# Patient Record
Sex: Male | Born: 1950 | Race: White | Hispanic: No | Marital: Married | Smoking: Never smoker
Health system: Southern US, Community
[De-identification: ages and names within clinical notes are randomized; demographics above are authoritative.]

## PROBLEM LIST (undated history)

## (undated) DIAGNOSIS — K219 Gastro-esophageal reflux disease without esophagitis: Secondary | ICD-10-CM

## (undated) DIAGNOSIS — I2510 Atherosclerotic heart disease of native coronary artery without angina pectoris: Secondary | ICD-10-CM

## (undated) DIAGNOSIS — K635 Polyp of colon: Secondary | ICD-10-CM

## (undated) DIAGNOSIS — K5792 Diverticulitis of intestine, part unspecified, without perforation or abscess without bleeding: Secondary | ICD-10-CM

## (undated) DIAGNOSIS — E7800 Pure hypercholesterolemia, unspecified: Secondary | ICD-10-CM

## (undated) DIAGNOSIS — I10 Essential (primary) hypertension: Secondary | ICD-10-CM

## (undated) HISTORY — PX: TONSILLECTOMY: SUR1361

## (undated) HISTORY — DX: Diverticulitis of intestine, part unspecified, without perforation or abscess without bleeding: K57.92

## (undated) HISTORY — PX: ABDOMINAL PERINEAL BOWEL RESECTION: SHX1111

## (undated) HISTORY — DX: Pure hypercholesterolemia, unspecified: E78.00

## (undated) HISTORY — DX: Atherosclerotic heart disease of native coronary artery without angina pectoris: I25.10

## (undated) HISTORY — PX: KNEE ARTHROSCOPY: SUR90

## (undated) HISTORY — DX: Polyp of colon: K63.5

## (undated) HISTORY — DX: Gastro-esophageal reflux disease without esophagitis: K21.9

## (undated) HISTORY — DX: Essential (primary) hypertension: I10

## (undated) HISTORY — PX: HERNIA REPAIR: SHX51

---

## 2015-12-25 DIAGNOSIS — C44519 Basal cell carcinoma of skin of other part of trunk: Secondary | ICD-10-CM | POA: Insufficient documentation

## 2016-07-30 LAB — HM COLONOSCOPY

## 2019-01-11 DIAGNOSIS — K432 Incisional hernia without obstruction or gangrene: Secondary | ICD-10-CM | POA: Insufficient documentation

## 2020-05-08 DIAGNOSIS — M793 Panniculitis, unspecified: Secondary | ICD-10-CM | POA: Insufficient documentation

## 2020-05-08 DIAGNOSIS — E65 Localized adiposity: Secondary | ICD-10-CM | POA: Insufficient documentation

## 2020-05-10 DIAGNOSIS — I959 Hypotension, unspecified: Secondary | ICD-10-CM | POA: Insufficient documentation

## 2020-07-05 ENCOUNTER — Ambulatory Visit (INDEPENDENT_AMBULATORY_CARE_PROVIDER_SITE_OTHER): Payer: Medicare Other | Admitting: Family Medicine

## 2020-07-05 ENCOUNTER — Encounter: Payer: Self-pay | Admitting: Family Medicine

## 2020-07-05 ENCOUNTER — Other Ambulatory Visit: Payer: Self-pay

## 2020-07-05 VITALS — BP 129/74 | HR 67 | Temp 97.9°F | Ht 74.0 in | Wt 261.4 lb

## 2020-07-05 DIAGNOSIS — C44519 Basal cell carcinoma of skin of other part of trunk: Secondary | ICD-10-CM

## 2020-07-05 DIAGNOSIS — I2510 Atherosclerotic heart disease of native coronary artery without angina pectoris: Secondary | ICD-10-CM

## 2020-07-05 DIAGNOSIS — I10 Essential (primary) hypertension: Secondary | ICD-10-CM

## 2020-07-05 DIAGNOSIS — E785 Hyperlipidemia, unspecified: Secondary | ICD-10-CM

## 2020-07-05 DIAGNOSIS — D485 Neoplasm of uncertain behavior of skin: Secondary | ICD-10-CM | POA: Diagnosis not present

## 2020-07-05 DIAGNOSIS — D500 Iron deficiency anemia secondary to blood loss (chronic): Secondary | ICD-10-CM

## 2020-07-05 DIAGNOSIS — N400 Enlarged prostate without lower urinary tract symptoms: Secondary | ICD-10-CM

## 2020-07-05 NOTE — Assessment & Plan Note (Signed)
Blood loss anemia related to hematoma after recent hernia repair.  Check CBC and ferritin.

## 2020-07-05 NOTE — Assessment & Plan Note (Signed)
Followed by cardiology in [place] for non-obstructive CAD and HTN.  Requests referral to cardiology here.  Referral entered.

## 2020-07-05 NOTE — Assessment & Plan Note (Signed)
Update PSA 

## 2020-07-05 NOTE — Assessment & Plan Note (Signed)
Blood pressure is at goal at for age and co-morbidities.  I recommend he continue current medications.  In addition they were instructed to follow a low sodium diet with regular exercise to help to maintain adequate control of blood pressure.   

## 2020-07-05 NOTE — Patient Instructions (Addendum)
Great to meet you today! Have labs completed.  We'll be in touch with results.  You'll be contacted from specialists office.  See me again in 6 months or sooner if needed

## 2020-07-05 NOTE — Assessment & Plan Note (Signed)
Check direct LDL today.

## 2020-07-05 NOTE — Assessment & Plan Note (Signed)
History of neoplasm of the skin.  Referral entered to dermatology for continued monitoring.

## 2020-07-05 NOTE — Progress Notes (Signed)
Terry Acosta - 69 y.o. male MRN [id number]  Date of birth: [birth year]  Subjective Chief Complaint  Patient presents with  . Establish Care    HPI Terry Acosta is a 69 y.o. male here today for initial visit.  He recently moved to the area from [place].  He has a history of HTN, CAD, HLD, and OSA.  He has had multiple abdominal surgeries including bowel resections for diverticulitis and subsequent hernia repairs.  He had hernia repair just before moving to the area and had post operative hematoma and anemia.  He needs updated CBC.     He was followed by cardiology in [place].  History of non-obstructive CAD.  He does also have HTN managed with lisinopril and atenolol.  Denies symptoms related to HTN including chest pain, shortness of breath, palpitations, headache or vision changes.    HLD managed with crestor, tolerating well.  No myalgias.  ROS:  A comprehensive ROS was completed and negative except as noted per HPI  Allergies  Allergen Reactions  . Penicillins Other (See Comments)    Syncope  . Shellfish Allergy     Past Medical History:  Diagnosis Date  . Colon polyps   . High cholesterol     Past Surgical History:  Procedure Laterality Date  . ABDOMINAL PERINEAL BOWEL RESECTION     x 2  . HERNIA REPAIR     x 6  . KNEE ARTHROSCOPY Bilateral     Social History   Socioeconomic History  . Marital status: Married    Spouse name: Not on file  . Number of children: Not on file  . Years of education: Not on file  . Highest education level: Not on file  Occupational History  . Occupation: Self Employed  Tobacco Use  . Smoking status: Never Smoker  . Smokeless tobacco: Never Used  Vaping Use  . Vaping Use: Never used  Substance and Sexual Activity  . Alcohol use: Yes    Alcohol/week: 5.0 standard drinks    Types: 5 Standard drinks or equivalent per week  . Drug use: Never  . Sexual activity: Yes    Partners: Female  Other Topics Concern  . Not on file   Social History Narrative  . Not on file   Social Determinants of Health   Financial Resource Strain:   . Difficulty of Paying Living Expenses:   Food Insecurity:   . Worried About Charity fundraiser in the Last Year:   . Arboriculturist in the Last Year:   Transportation Needs:   . Film/video editor (Medical):   Marland Kitchen Lack of Transportation (Non-Medical):   Physical Activity:   . Days of Exercise per Week:   . Minutes of Exercise per Session:   Stress:   . Feeling of Stress :   Social Connections:   . Frequency of Communication with Friends and Family:   . Frequency of Social Gatherings with Friends and Family:   . Attends Religious Services:   . Active Member of Clubs or Organizations:   . Attends Archivist Meetings:   Marland Kitchen Marital Status:     Family History  Problem Relation Age of Onset  . Hypertension Father   . Prostate cancer Father     Health Maintenance  Topic Date Due  . Hepatitis C Screening  Never done  . COLONOSCOPY  Never done  . PNA vac Low Risk Adult (2 of 2 - PPSV23) 12/15/2015  . INFLUENZA VACCINE  07/01/2020  . TETANUS/TDAP  12/16/2023  . COVID-19 Vaccine  Completed     ----------------------------------------------------------------------------------------------------------------------------------------------------------------------------------------------------------------- Physical Exam BP 129/74 (BP Location: Left Arm, Patient Position: Sitting, Cuff Size: Large)   Pulse 67   Temp 97.9 F (36.6 C) (Temporal)   Ht 6\' 2"  (1.88 m)   Wt 261 lb 6.4 oz (118.6 kg)   SpO2 99%   BMI 33.56 kg/m   Physical Exam Constitutional:      Appearance: Normal appearance.  HENT:     Head: Normocephalic and atraumatic.     Mouth/Throat:     Mouth: Mucous membranes are moist.  Eyes:     General: No scleral icterus. Cardiovascular:     Rate and Rhythm: Normal rate and regular rhythm.  Pulmonary:     Effort: Pulmonary effort is normal.      Breath sounds: Normal breath sounds.  Musculoskeletal:     Cervical back: Neck supple.  Neurological:     General: No focal deficit present.     Mental Status: He is alert.  Psychiatric:        Mood and Affect: Mood normal.        Behavior: Behavior normal.     ------------------------------------------------------------------------------------------------------------------------------------------------------------------------------------------------------------------- Assessment and Plan  Essential hypertension Blood pressure is at goal at for age and co-morbidities.  I recommend he continue current medications.  In addition they were instructed to follow a low sodium diet with regular exercise to help to maintain adequate control of blood pressure.    Basal cell carcinoma of abdomen History of neoplasm of the skin.  Referral entered to dermatology for continued monitoring.    Benign prostatic hyperplasia without lower urinary tract symptoms Update PSA  Blood loss anemia Blood loss anemia related to hematoma after recent hernia repair.  Check CBC and ferritin.    Hyperlipidemia Check direct LDL today.   Coronary artery disease involving native coronary artery of native heart without angina pectoris Followed by cardiology in [place] for non-obstructive CAD and HTN.  Requests referral to cardiology here.  Referral entered.     No orders of the defined types were placed in this encounter.   Return in about 6 months (around 01/05/2021) for HTN/HLD/Insomnia.    This visit occurred during the SARS-CoV-2 public health emergency.  Safety protocols were in place, including screening questions prior to the visit, additional usage of staff PPE, and extensive cleaning of exam room while observing appropriate contact time as indicated for disinfecting solutions.

## 2020-07-07 LAB — COMPLETE METABOLIC PANEL WITH GFR
AG Ratio: 1.4 (calc) (ref 1.0–2.5)
ALT: 14 U/L (ref 9–46)
AST: 17 U/L (ref 10–35)
Albumin: 3.7 g/dL (ref 3.6–5.1)
Alkaline phosphatase (APISO): 63 U/L (ref 35–144)
BUN/Creatinine Ratio: 13 (calc) (ref 6–22)
BUN: 9 mg/dL (ref 7–25)
CO2: 27 mmol/L (ref 20–32)
Calcium: 8.9 mg/dL (ref 8.6–10.3)
Chloride: 108 mmol/L (ref 98–110)
Creat: 0.68 mg/dL — ABNORMAL LOW (ref 0.70–1.18)
GFR, Est African American: 111 mL/min/{1.73_m2} (ref >=60)
GFR, Est Non African American: 96 mL/min/{1.73_m2} (ref >=60)
Globulin: 2.6 g/dL (calc) (ref 1.9–3.7)
Glucose, Bld: 110 mg/dL — ABNORMAL HIGH (ref 65–99)
Potassium: 5 mmol/L (ref 3.5–5.3)
Sodium: 144 mmol/L (ref 135–146)
Total Bilirubin: 0.4 mg/dL (ref 0.2–1.2)
Total Protein: 6.3 g/dL (ref 6.1–8.1)

## 2020-07-07 LAB — CBC
HCT: 33.8 % — ABNORMAL LOW (ref 38.5–50.0)
Hemoglobin: 10.4 g/dL — ABNORMAL LOW (ref 13.2–17.1)
MCH: 25.3 pg — ABNORMAL LOW (ref 27.0–33.0)
MCHC: 30.8 g/dL — ABNORMAL LOW (ref 32.0–36.0)
MCV: 82.2 fL (ref 80.0–100.0)
MPV: 11.3 fL (ref 7.5–12.5)
Platelets: 295 10*3/uL (ref 140–400)
RBC: 4.11 10*6/uL — ABNORMAL LOW (ref 4.20–5.80)
RDW: 15.7 % — ABNORMAL HIGH (ref 11.0–15.0)
WBC: 5.4 10*3/uL (ref 3.8–10.8)

## 2020-07-07 LAB — PSA: PSA: 2.6 ng/mL (ref <=4.0)

## 2020-07-07 LAB — LDL CHOLESTEROL, DIRECT: Direct LDL: 71 mg/dL (ref <100)

## 2020-07-07 LAB — FERRITIN: Ferritin: 14 ng/mL — ABNORMAL LOW (ref 24–380)

## 2020-08-29 NOTE — Progress Notes (Signed)
Referring-Cody Zigmund Daniel, DO Reason for referral-coronary artery disease  HPI: 69 year old male for evaluation of coronary artery disease at request of Luetta Nutting, DO.  Holter monitor 2017 showed sinus rhythm with PACs and what is described as 4-6 beats of paroxysms of atrial fibrillation.  Echocardiogram 2017 showed normal LV function and mild left ventricular hypertrophy.  Abdominal ultrasound February 2019 showed no aneurysm.  Cardiac catheterization March 2019 showed less than 50% involving the mid right coronary artery and proximal LAD.  Patient recently moved to this area from [place] and presents to establish.  He denies dyspnea, chest pain, palpitations or syncope.  He has minimal pedal edema.  He does have some dizziness with standing at times.  He states he underwent abdominal surgery in February complicated by blood loss.  His symptoms have been present since then but are slowly improving.  Current Outpatient Medications  Medication Sig Dispense Refill   atenolol (TENORMIN) 50 MG tablet Take 50 mg by mouth daily.     esomeprazole (NEXIUM) 40 MG capsule Take 40 mg by mouth daily at 12 noon.     Ferrous Sulfate Dried (HIGH POTENCY IRON) 65 MG TABS Take 1 tablet by mouth as directed.     hydrOXYzine (ATARAX/VISTARIL) 25 MG tablet Take 25 mg by mouth in the morning.     lisinopril (ZESTRIL) 5 MG tablet Take 5 mg by mouth daily.     magnesium gluconate (MAGONATE) 500 MG tablet Take 500 mg by mouth daily.     Multiple Vitamin (MULTIVITAMIN) capsule Take 1 capsule by mouth daily.     Respiratory Therapy Supplies DEVI by Does not apply route.     rosuvastatin (CRESTOR) 40 MG tablet Take 40 mg by mouth daily.     Turmeric (QC TUMERIC COMPLEX PO) Take 1 tablet by mouth daily.     zolpidem (AMBIEN) 5 MG tablet      No current facility-administered medications for this visit.    Allergies  Allergen Reactions   Penicillins Other (See Comments)    Syncope   Shellfish  Allergy      Past Medical History:  Diagnosis Date   CAD (coronary artery disease)    Colon polyps    Diverticulitis    GERD (gastroesophageal reflux disease)    High cholesterol    Hypertension     Past Surgical History:  Procedure Laterality Date   ABDOMINAL PERINEAL BOWEL RESECTION     x 2   HERNIA REPAIR     x 6   KNEE ARTHROSCOPY Bilateral    TONSILLECTOMY      Social History   Socioeconomic History   Marital status: Married    Spouse name: Not on file   Number of children: 2   Years of education: Not on file   Highest education level: Not on file  Occupational History   Occupation: Self Employed  Tobacco Use   Smoking status: Never Smoker   Smokeless tobacco: Never Used  Scientific laboratory technician Use: Never used  Substance and Sexual Activity   Alcohol use: Yes    Alcohol/week: 5.0 standard drinks    Types: 5 Standard drinks or equivalent per week   Drug use: Never   Sexual activity: Yes    Partners: Female  Other Topics Concern   Not on file  Social History Narrative   Not on file   Social Determinants of Health   Financial Resource Strain:    Difficulty of Paying Living Expenses: Not on  file  Food Insecurity:    Worried About Charity fundraiser in the Last Year: Not on file   [employer] of Food in the Last Year: Not on file  Transportation Needs:    Lack of Transportation (Medical): Not on file   Lack of Transportation (Non-Medical): Not on file  Physical Activity:    Days of Exercise per Week: Not on file   Minutes of Exercise per Session: Not on file  Stress:    Feeling of Stress : Not on file  Social Connections:    Frequency of Communication with Friends and Family: Not on file   Frequency of Social Gatherings with Friends and Family: Not on file   Attends Religious Services: Not on file   Active Member of Clubs or Organizations: Not on file   Attends Archivist Meetings: Not on file   Marital  Status: Not on file  Intimate Partner Violence:    Fear of Current or Ex-Partner: Not on file   Emotionally Abused: Not on file   Physically Abused: Not on file   Sexually Abused: Not on file    Family History  Problem Relation Age of Onset   Hypertension Father    Prostate cancer Father     ROS: no fevers or chills, productive cough, hemoptysis, dysphasia, odynophagia, melena, hematochezia, dysuria, hematuria, rash, seizure activity, orthopnea, PND, pedal edema, claudication. Remaining systems are negative.  Physical Exam:   Blood pressure (!) 153/85, pulse 67, height 6\' 2"  (1.88 m), weight 266 lb 1.9 oz (120.7 kg), SpO2 96 %.  General:  Well developed/well nourished in NAD Skin warm/dry Patient not depressed No peripheral clubbing Back-normal HEENT-normal/normal eyelids Neck supple/normal carotid upstroke bilaterally; no bruits; no JVD; no thyromegaly chest - CTA/ normal expansion CV - RRR/normal S1 and S2; no murmurs, rubs or gallops;  PMI nondisplaced Abdomen -NT/ND, no HSM, no mass, + bowel sounds, no bruit 2+ femoral pulses, no bruits Ext-trace edema, no chords, 2+ DP Neuro-grossly nonfocal  ECG -sinus rhythm at a rate of 67, occasional PVC, no ST changes.  Personally reviewed  A/P  1 coronary artery disease-plan to continue statin.  Resume aspirin 81 mg daily as he has had no recurrent bleeding.  He is not having chest pain and we will plan medical therapy.  2 hyperlipidemia-continue statin.  3 hypertension-blood pressure is controlled.  Continue present medications and follow.  4 question atrial fibrillation-previous Holter report describes 4 and 6 beat runs of atrial fibrillation.  Strips are not available for review.  This would seem to be very short and I cannot be sure that this is true atrial fibrillation.  Kirk Ruths, MD

## 2020-09-05 ENCOUNTER — Other Ambulatory Visit: Payer: Self-pay

## 2020-09-05 ENCOUNTER — Ambulatory Visit (INDEPENDENT_AMBULATORY_CARE_PROVIDER_SITE_OTHER): Payer: Medicare Other | Admitting: Cardiology

## 2020-09-05 ENCOUNTER — Encounter: Payer: Self-pay | Admitting: Cardiology

## 2020-09-05 VITALS — BP 153/85 | HR 67 | Ht 74.0 in | Wt 266.1 lb

## 2020-09-05 DIAGNOSIS — E7800 Pure hypercholesterolemia, unspecified: Secondary | ICD-10-CM

## 2020-09-05 DIAGNOSIS — I10 Essential (primary) hypertension: Secondary | ICD-10-CM | POA: Diagnosis not present

## 2020-09-05 DIAGNOSIS — I2510 Atherosclerotic heart disease of native coronary artery without angina pectoris: Secondary | ICD-10-CM | POA: Diagnosis not present

## 2020-09-05 MED ORDER — ASPIRIN EC 81 MG PO TBEC: 81.0000 mg | DELAYED_RELEASE_TABLET | Freq: Every day | ORAL | 3 refills | Status: DC

## 2020-09-05 NOTE — Patient Instructions (Signed)
Medication Instructions:   START ASPIRIN 81 MG ONCE DAILY  *If you need a refill on your cardiac medications before your next appointment, please call your pharmacy*   Follow-Up: At [facility], you and your health needs are our priority.  As part of our continuing mission to provide you with exceptional heart care, we have created designated Provider Care Teams.  These Care Teams include your primary Cardiologist (physician) and Advanced Practice Providers (APPs -  Physician Assistants and Nurse Practitioners) who all work together to provide you with the care you need, when you need it.  We recommend signing up for the patient portal called "MyChart".  Sign up information is provided on this After Visit Summary.  MyChart is used to connect with patients for Virtual Visits (Telemedicine).  Patients are able to view lab/test results, encounter notes, upcoming appointments, etc.  Non-urgent messages can be sent to your provider as well.   To learn more about what you can do with MyChart, go to [email or web address].    Your next appointment:   6 month(s)  The format for your next appointment:   In Person  Provider:   Brian Crenshaw, MD    

## 2020-09-17 ENCOUNTER — Encounter: Payer: Self-pay | Admitting: Family Medicine

## 2020-09-18 ENCOUNTER — Other Ambulatory Visit: Payer: Self-pay

## 2020-09-18 MED ORDER — ZOLPIDEM TARTRATE 5 MG PO TABS
5.0000 mg | ORAL_TABLET | Freq: Every day | ORAL | 0 refills | Status: DC
Start: 2020-09-18 — End: 2020-09-19

## 2020-09-18 NOTE — Progress Notes (Unsigned)
Per patient request

## 2020-09-19 ENCOUNTER — Other Ambulatory Visit: Payer: Self-pay | Admitting: Family Medicine

## 2020-09-19 MED ORDER — ZOLPIDEM TARTRATE 5 MG PO TABS
5.0000 mg | ORAL_TABLET | Freq: Every day | ORAL | 1 refills | Status: DC
Start: 2020-09-19 — End: 2021-03-19

## 2020-09-21 ENCOUNTER — Other Ambulatory Visit: Payer: Self-pay | Admitting: Family Medicine

## 2020-09-21 DIAGNOSIS — G4733 Obstructive sleep apnea (adult) (pediatric): Secondary | ICD-10-CM

## 2020-09-21 MED ORDER — AMBULATORY NON FORMULARY MEDICATION: 0 refills | Status: AC

## 2020-09-25 ENCOUNTER — Telehealth: Payer: Self-pay

## 2020-09-25 NOTE — Telephone Encounter (Signed)
Received Sleep Study from Dr. Normajean Glasgow.   Faxed to [facility] with demographics ([phone]).

## 2020-11-13 ENCOUNTER — Encounter: Payer: Self-pay | Admitting: Family Medicine

## 2020-11-13 ENCOUNTER — Other Ambulatory Visit: Payer: Self-pay

## 2020-11-13 ENCOUNTER — Ambulatory Visit (INDEPENDENT_AMBULATORY_CARE_PROVIDER_SITE_OTHER): Payer: Medicare Other | Admitting: Family Medicine

## 2020-11-13 DIAGNOSIS — L739 Follicular disorder, unspecified: Secondary | ICD-10-CM | POA: Diagnosis not present

## 2020-11-13 MED ORDER — MUPIROCIN 2 % EX OINT: 1.0000 "application " | TOPICAL_OINTMENT | Freq: Two times a day (BID) | CUTANEOUS | 0 refills | Status: AC

## 2020-11-13 MED ORDER — DOXYCYCLINE HYCLATE 100 MG PO TABS: 100.0000 mg | ORAL_TABLET | Freq: Two times a day (BID) | ORAL | 0 refills | Status: DC

## 2020-11-13 NOTE — Progress Notes (Signed)
Terry Acosta - 69 y.o. male MRN [id number]  Date of birth: Sep 11, 1951  Subjective No chief complaint on file.   HPI Terry Acosta is a 69 y.o. male here today with complaint of rash in groin area.  Reports some pain redness and swelling along upper pubic area.  This started a couple of days ago.  Some pustules that he has popped.  He has had this before and treated with antibiotic.  Told he had MRSA in the past.  He denies swelling, fever, chills.    ROS:  A comprehensive ROS was completed and negative except as noted per HPI  Allergies  Allergen Reactions  . Penicillins Other (See Comments)    Syncope  . Shellfish Allergy     Past Medical History:  Diagnosis Date  . CAD (coronary artery disease)   . Colon polyps   . Diverticulitis   . GERD (gastroesophageal reflux disease)   . High cholesterol   . Hypertension     Past Surgical History:  Procedure Laterality Date  . ABDOMINAL PERINEAL BOWEL RESECTION     x 2  . HERNIA REPAIR     x 6  . KNEE ARTHROSCOPY Bilateral   . TONSILLECTOMY      Social History   Socioeconomic History  . Marital status: Married    Spouse name: Not on file  . Number of children: 2  . Years of education: Not on file  . Highest education level: Not on file  Occupational History  . Occupation: Self Employed  Tobacco Use  . Smoking status: Never Smoker  . Smokeless tobacco: Never Used  Vaping Use  . Vaping Use: Never used  Substance and Sexual Activity  . Alcohol use: Yes    Alcohol/week: 5.0 standard drinks    Types: 5 Standard drinks or equivalent per week  . Drug use: Never  . Sexual activity: Yes    Partners: Female  Other Topics Concern  . Not on file  Social History Narrative  . Not on file   Social Determinants of Health   Financial Resource Strain: Not on file  Food Insecurity: Not on file  Transportation Needs: Not on file  Physical Activity: Not on file  Stress: Not on file  Social Connections: Not on file     Family History  Problem Relation Age of Onset  . Hypertension Father   . Prostate cancer Father     Health Maintenance  Topic Date Due  . Hepatitis C Screening  Never done  . COLONOSCOPY  Never done  . COVID-19 Vaccine (3 - Moderna risk 4-dose series) 04/04/2020  . INFLUENZA VACCINE  07/01/2020  . TETANUS/TDAP  12/16/2023  . PNA vac Low Risk Adult  Completed     ----------------------------------------------------------------------------------------------------------------------------------------------------------------------------------------------------------------- Physical Exam BP (!) 155/80 (BP Location: Left Arm, Patient Position: Sitting)   Pulse 67   Temp 97.8 F (36.6 C)   Wt 277 lb 12.8 oz (126 kg)   SpO2 100%   BMI 35.67 kg/m   Physical Exam Constitutional:      Appearance: Normal appearance.  Eyes:     General: No scleral icterus. Musculoskeletal:     Cervical back: Neck supple.  Skin:    Comments: Several scattered pustules at base of hair follicles around the upper pubic area.   Neurological:     General: No focal deficit present.     Mental Status: He is alert.  Psychiatric:        Mood and Affect: Mood normal.  Behavior: Behavior normal.     ------------------------------------------------------------------------------------------------------------------------------------------------------------------------------------------------------------------- Assessment and Plan  Folliculitis Start doxycycline 100mg  twice day.  Will also add mupirocin ointment twice daily.  Instructed to contact clinic if this is not resolving or he develops new/worsening symptoms.    Meds ordered this encounter  Medications  . doxycycline (VIBRA-TABS) 100 MG tablet    Sig: Take 1 tablet (100 mg total) by mouth 2 (two) times daily.    Dispense:  20 tablet    Refill:  0  . mupirocin ointment (BACTROBAN) 2 %    Sig: Apply 1 application topically 2 (two)  times daily for 7 days.    Dispense:  22 g    Refill:  0    No follow-ups on file.    This visit occurred during the SARS-CoV-2 public health emergency.  Safety protocols were in place, including screening questions prior to the visit, additional usage of staff PPE, and extensive cleaning of exam room while observing appropriate contact time as indicated for disinfecting solutions.

## 2020-11-13 NOTE — Patient Instructions (Signed)

## 2020-11-13 NOTE — Assessment & Plan Note (Addendum)
Start doxycycline 100mg  twice day.  Will also add mupirocin ointment twice daily.  Instructed to contact clinic if this is not resolving or he develops new/worsening symptoms.

## 2020-12-21 ENCOUNTER — Telehealth: Payer: Self-pay

## 2020-12-21 NOTE — Telephone Encounter (Signed)
PT lvm stating COVID Exposure from trip to nursing facility in [place].   Asymptomatic at this time. Wore a mask while at the facility. Stayed at facility for 2 hours.   Advised patient of locations for OTC COVID tests and rapid tests.

## 2020-12-31 ENCOUNTER — Encounter: Payer: Self-pay | Admitting: Family Medicine

## 2020-12-31 ENCOUNTER — Other Ambulatory Visit: Payer: Self-pay

## 2020-12-31 ENCOUNTER — Ambulatory Visit (INDEPENDENT_AMBULATORY_CARE_PROVIDER_SITE_OTHER): Payer: Medicare Other | Admitting: Family Medicine

## 2020-12-31 VITALS — BP 143/74 | HR 64 | Temp 97.4°F | Wt 274.9 lb

## 2020-12-31 DIAGNOSIS — G4700 Insomnia, unspecified: Secondary | ICD-10-CM | POA: Insufficient documentation

## 2020-12-31 DIAGNOSIS — I10 Essential (primary) hypertension: Secondary | ICD-10-CM

## 2020-12-31 DIAGNOSIS — F5101 Primary insomnia: Secondary | ICD-10-CM

## 2020-12-31 DIAGNOSIS — K432 Incisional hernia without obstruction or gangrene: Secondary | ICD-10-CM | POA: Diagnosis not present

## 2020-12-31 DIAGNOSIS — N529 Male erectile dysfunction, unspecified: Secondary | ICD-10-CM | POA: Insufficient documentation

## 2020-12-31 DIAGNOSIS — E7800 Pure hypercholesterolemia, unspecified: Secondary | ICD-10-CM

## 2020-12-31 DIAGNOSIS — D500 Iron deficiency anemia secondary to blood loss (chronic): Secondary | ICD-10-CM | POA: Diagnosis not present

## 2020-12-31 DIAGNOSIS — D509 Iron deficiency anemia, unspecified: Secondary | ICD-10-CM | POA: Diagnosis not present

## 2020-12-31 MED ORDER — TADALAFIL 5 MG PO TABS: 5.0000 mg | ORAL_TABLET | Freq: Every day | ORAL | 3 refills | Status: DC | PRN

## 2020-12-31 NOTE — Assessment & Plan Note (Signed)
Doing well with crestor.  Recommend continuation.

## 2020-12-31 NOTE — Assessment & Plan Note (Signed)
Having increased discomfort at site of previous repairs.  Referral placed to general surgery.

## 2020-12-31 NOTE — Assessment & Plan Note (Signed)
He is taking iron supplement.  Update iron panel and cbc today.  He reports that colonoscopy is up to date and will have last results sent to [place].

## 2020-12-31 NOTE — Patient Instructions (Signed)
Great to see you today! Schedule with Dr. Darene Lamer for the shoulder.  Have labs completed.  Use GoodRx for tadalfil.  Rx sent to [facility].   See me again in about 6 months.

## 2020-12-31 NOTE — Assessment & Plan Note (Signed)
HTN has been well controlled with current medications of lisinopril and atenolol.  He'll follow a low sodium diet and continue to remain active.

## 2020-12-31 NOTE — Assessment & Plan Note (Signed)
Doing well with vistaril as needed.  Will also use ambien some nights, working well.  Will plan to continue at current dosing.

## 2020-12-31 NOTE — Assessment & Plan Note (Signed)
Will provide trial of tadalafil.  Side effects and precautions reviewed including prolonged erection and avoidance of nitrate containing medications.  He will use goodrx to get medication.

## 2020-12-31 NOTE — Progress Notes (Signed)
Terry Acosta - 70 y.o. male MRN [id number]  Date of birth: [birth year]  Subjective Chief Complaint  Patient presents with  . Hyperlipidemia  . Hypertension  . Insomnia    HPI Terry Acosta is a 70 y.o. male here today for follow up of HTN, HLD, and Insomnia.   HTN currently managed with atenolol and lisinopril.  He has done well with this with normal readings at home.  He has not had chest pain, shortness of breath, palpitations, headache or vision changes.   HLD treated with crestor 40mg , tolerating well.   Insomnia remains well managed with vistaril and/or ambien at 5mg  daily as needed.    Iron deficiency anemia noted on previous labs.  Currently taking iron supplement.  He reports having colonoscopy in 2018, will have report sent.  Denies dark stools.   History of multiple ventral hernia repairs.  Having some increased abdominal pressure again and would like to have this checked out by a surgeon.   He is also having some L shoulder pain.  Painful when trying to raise the arm.  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  Plans to schedule with Dr. Dianah Field.    Has had some issues with ED.  Success with sildenafil previously but too expensive.   ROS:  A comprehensive ROS was completed and negative except as noted per HPI  Allergies  Allergen Reactions  . Penicillins Other (See Comments)    Syncope  . Shellfish Allergy     Past Medical History:  Diagnosis Date  . CAD (coronary artery disease)   . Colon polyps   . Diverticulitis   . GERD (gastroesophageal reflux disease)   . High cholesterol   . Hypertension     Past Surgical History:  Procedure Laterality Date  . ABDOMINAL PERINEAL BOWEL RESECTION     x 2  . HERNIA REPAIR     x 6  . KNEE ARTHROSCOPY Bilateral   . TONSILLECTOMY      Social History   Socioeconomic History  . Marital status: Married    Spouse name: Not on file  . Number of children: 2  . Years of education: Not on file   . Highest education level: Not on file  Occupational History  . Occupation: Self Employed  Tobacco Use  . Smoking status: Never Smoker  . Smokeless tobacco: Never Used  Vaping Use  . Vaping Use: Never used  Substance and Sexual Activity  . Alcohol use: Yes    Alcohol/week: 5.0 standard drinks    Types: 5 Standard drinks or equivalent per week  . Drug use: Never  . Sexual activity: Yes    Partners: Female  Other Topics Concern  . Not on file  Social History Narrative  . Not on file   Social Determinants of Health   Financial Resource Strain: Not on file  Food Insecurity: Not on file  Transportation Needs: Not on file  Physical Activity: Not on file  Stress: Not on file  Social Connections: Not on file    Family History  Problem Relation Age of Onset  . Hypertension Father   . Prostate cancer Father     Health Maintenance  Topic Date Due  . Hepatitis C Screening  Never done  . COLONOSCOPY (Pts 45-29yrs Insurance coverage will need to be confirmed)  Never done  . COVID-19 Vaccine (4 - Booster for Moderna series) 03/31/2021  . TETANUS/TDAP  12/16/2023  . INFLUENZA VACCINE  Completed  . PNA vac  Low Risk Adult  Completed     ----------------------------------------------------------------------------------------------------------------------------------------------------------------------------------------------------------------- Physical Exam BP (!) 143/74 (BP Location: Left Arm, Patient Position: Sitting, Cuff Size: Normal)   Pulse 64   Temp (!) 97.4 F (36.3 C)   Wt 274 lb 14.4 oz (124.7 kg)   SpO2 98%   BMI 35.30 kg/m   Physical Exam Constitutional:      Appearance: Normal appearance.  Eyes:     General: No scleral icterus. Cardiovascular:     Rate and Rhythm: Normal rate and regular rhythm.  Pulmonary:     Effort: Pulmonary effort is normal.     Breath sounds: Normal breath sounds.  Musculoskeletal:     Cervical back: Neck supple.  Neurological:      General: No focal deficit present.     Mental Status: He is alert.  Psychiatric:        Mood and Affect: Mood normal.        Behavior: Behavior normal.     ------------------------------------------------------------------------------------------------------------------------------------------------------------------------------------------------------------------- Assessment and Plan  Essential hypertension HTN has been well controlled with current medications of lisinopril and atenolol.  He'll follow a low sodium diet and continue to remain active.    Ventral hernia, recurrent Having increased discomfort at site of previous repairs.  Referral placed to general surgery.   Iron deficiency anemia He is taking iron supplement.  Update iron panel and cbc today.  He reports that colonoscopy is up to date and will have last results sent to [place].   Hyperlipidemia Doing well with crestor.  Recommend continuation.     Insomnia Doing well with vistaril as needed.  Will also use ambien some nights, working well.  Will plan to continue at current dosing.    Erectile dysfunction Will provide trial of tadalafil.  Side effects and precautions reviewed including prolonged erection and avoidance of nitrate containing medications.  He will use goodrx to get medication.   Meds ordered this encounter  Medications  . tadalafil (CIALIS) 5 MG tablet    Sig: Take 1-2 tablets (5-10 mg total) by mouth daily as needed for erectile dysfunction. Using good Rx card    Dispense:  30 tablet    Refill:  3    Return in about 6 months (around 06/30/2021) for HTN/HLD.    This visit occurred during the SARS-CoV-2 public health emergency.  Safety protocols were in place, including screening questions prior to the visit, additional usage of staff PPE, and extensive cleaning of exam room while observing appropriate contact time as indicated for disinfecting solutions.

## 2021-01-02 ENCOUNTER — Ambulatory Visit (INDEPENDENT_AMBULATORY_CARE_PROVIDER_SITE_OTHER): Payer: Medicare Other | Admitting: Sports Medicine

## 2021-01-02 ENCOUNTER — Ambulatory Visit (INDEPENDENT_AMBULATORY_CARE_PROVIDER_SITE_OTHER): Payer: Medicare Other

## 2021-01-02 ENCOUNTER — Other Ambulatory Visit: Payer: Self-pay

## 2021-01-02 DIAGNOSIS — M75122 Complete rotator cuff tear or rupture of left shoulder, not specified as traumatic: Secondary | ICD-10-CM | POA: Diagnosis not present

## 2021-01-02 DIAGNOSIS — M25512 Pain in left shoulder: Secondary | ICD-10-CM

## 2021-01-02 DIAGNOSIS — M75102 Unspecified rotator cuff tear or rupture of left shoulder, not specified as traumatic: Secondary | ICD-10-CM | POA: Insufficient documentation

## 2021-01-02 MED ORDER — MELOXICAM 15 MG PO TABS: ORAL_TABLET | ORAL | 3 refills | Status: DC

## 2021-01-02 NOTE — Assessment & Plan Note (Signed)
This is a very pleasant 70 year old male, he has a several year history of pain in his left shoulder, localized over the deltoid with significant weakness to most motions. He does have a history of a right-sided rotator cuff and biceps tear post tenodesis and cuff repair. On exam he has significant weakness to external rotation, abduction, as well as forward flexion all suggestive of supraspinatus, infraspinatus and long head of the biceps tears. We talked about the options including conservative treatment initially followed by surgical treatment if insufficient improvement. We are going to start conservative, meloxicam, formal physical therapy, x-rays. Return to see me in 6 weeks, MRI for surgical planning if no better.

## 2021-01-02 NOTE — Progress Notes (Signed)
    Procedures performed today:    None.  Independent interpretation of notes and tests performed by another provider:   None.  Brief History, Exam, Impression, and Recommendations:    Rotator cuff tear, left This is a very pleasant 70 year old male, he has a several year history of pain in his left shoulder, localized over the deltoid with significant weakness to most motions. He does have a history of a right-sided rotator cuff and biceps tear post tenodesis and cuff repair. On exam he has significant weakness to external rotation, abduction, as well as forward flexion all suggestive of supraspinatus, infraspinatus and long head of the biceps tears. We talked about the options including conservative treatment initially followed by surgical treatment if insufficient improvement. We are going to start conservative, meloxicam, formal physical therapy, x-rays. Return to see me in 6 weeks, MRI for surgical planning if no better.    ___________________________________________ Gwen Her. Dianah Field, M.D., ABFM., CAQSM. Primary Care and [place] Instructor of [employer] [facility]

## 2021-01-03 ENCOUNTER — Ambulatory Visit (INDEPENDENT_AMBULATORY_CARE_PROVIDER_SITE_OTHER): Payer: Medicare Other | Admitting: Physical Therapy

## 2021-01-03 DIAGNOSIS — M6281 Muscle weakness (generalized): Secondary | ICD-10-CM | POA: Diagnosis not present

## 2021-01-03 DIAGNOSIS — M25612 Stiffness of left shoulder, not elsewhere classified: Secondary | ICD-10-CM | POA: Diagnosis not present

## 2021-01-03 DIAGNOSIS — G8929 Other chronic pain: Secondary | ICD-10-CM | POA: Diagnosis not present

## 2021-01-03 DIAGNOSIS — M25512 Pain in left shoulder: Secondary | ICD-10-CM | POA: Diagnosis present

## 2021-01-03 NOTE — Patient Instructions (Signed)
Access Code: [email or web address] URL: [email or web address] Date: 01/03/2021 Prepared by: Almyra Free  Exercises Supine Shoulder External Rotation with Dumbbell - 1 x daily - 7 x weekly - 1-3 sets - 10 reps Supine Shoulder Flexion with Free Weight - 1 x daily - 7 x weekly - 1-33 sets - 10 reps Standing Single Arm Shoulder Abduction with Dumbbell - Thumb Up - 1 x daily - 7 x weekly - 1-3 sets - 10 reps Standing Shoulder Internal Rotation with Anchored Resistance - 1 x daily - 3 x weekly - 1-3 sets - 12 reps

## 2021-01-03 NOTE — Therapy (Signed)
[place], [place], [address] Phone: [phone]   Fax:  [phone]  Physical Therapy Evaluation  Patient Details  Name: Terry Acosta MRN: [id number] Date of Birth: [birth year] Referring Provider (PT): Aundria Mems, MD   Encounter Date: 01/03/2021   PT End of Session - 01/03/21 1105    Visit Number 1    PT Start Time 1105    PT Stop Time 1147    PT Time Calculation (min) 42 min    Activity Tolerance Patient tolerated treatment well    Behavior During Therapy [facility] for tasks assessed/performed           Past Medical History:  Diagnosis Date  . CAD (coronary artery disease)   . Colon polyps   . Diverticulitis   . GERD (gastroesophageal reflux disease)   . High cholesterol   . Hypertension     Past Surgical History:  Procedure Laterality Date  . ABDOMINAL PERINEAL BOWEL RESECTION     x 2  . HERNIA REPAIR     x 6  . KNEE ARTHROSCOPY Bilateral   . TONSILLECTOMY      There were no vitals filed for this visit.    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  About 5-6 years ago pushed through arms with full body wt. Tore right and injured left. Wakes him up at night. ROM is functional but lifting 2 Liter cannot flex or ABD arm. Nagging pain at night. Sleeps with hand on head when on side. Reaching arm during showering hurts.    Pertinent History HTN, bil TKR, Rt RCR, multiple abdominal surgeries and hernia repairs    Diagnostic tests xray: Lt A/C jt mild arthropathy    Patient Stated Goals I want no pain, I want strength back and get up to vertical position from playing with grandchild    Currently in Pain? Yes    Pain Score 7     Pain Location Shoulder    Pain Orientation Left    Pain Descriptors /  Indicators Nagging   at night nagging   Pain Type Chronic pain    Pain Onset More than a month ago    Pain Frequency Intermittent    Aggravating Factors  flex and ABD with weight; sleeping    Pain Relieving Factors puts arm at side when sleeping;              [facility] PT Assessment - 01/03/21 0001      Assessment   Medical Diagnosis nontraumatic complete tear of Lt Rotator Cuff    Referring Provider (PT) Aundria Mems, MD    Onset Date/Surgical Date 12/02/15    Hand Dominance Right    Next MD Visit 6 weeks      Precautions   Precautions None      Restrictions   Weight Bearing Restrictions No      Balance Screen   Has the patient fallen in the past 6 months No    Has the patient had a decrease in activity level because of a fear of falling?  No    Is the patient reluctant to leave their home because of a fear of falling?  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    Leisure motorcycling      ROM / Strength   AROM / PROM / Strength AROM;Strength;PROM      AROM   Overall AROM Comments functional ROM    AROM Assessment Site Shoulder    Right/Left Shoulder Left    Left Shoulder Extension 44 Degrees    Left Shoulder Flexion 163 Degrees    Left Shoulder ABduction 171 Degrees    Left Shoulder Internal Rotation 77 Degrees    Left Shoulder External Rotation 76 Degrees      PROM   PROM Assessment Site Shoulder    Right/Left Shoulder Left    Left Shoulder Extension 65 Degrees    Left Shoulder Flexion 180 Degrees    Left Shoulder ABduction 180 Degrees    Left Shoulder Internal Rotation 80 Degrees    Left Shoulder External Rotation 82 Degrees      Strength   Strength Assessment Site Shoulder    Right/Left Shoulder Left    Left Shoulder Flexion 4/5   pain   Left Shoulder Extension 5/5     Left Shoulder ABduction 4-/5   pain   Left Shoulder Internal Rotation 4+/5   mild pain   Left Shoulder External Rotation 4-/5   mld pain     Flexibility   Soft Tissue Assessment /Muscle Length --   subscapularis tightness     Palpation   Palpation comment left subscap tightness      Special Tests    Special Tests Rotator Cuff Impingement    Rotator Cuff Impingment tests Michel Bickers test;Lift- off test;Empty Can test;Full Can test      Hawkins-Kennedy test   Findings Negative    Side Left      Lift-Off test   Findings Positive    Side Left      Empty Can test   Findings Positive    Side Left      Full Can test   Findings Positive    Side Left                      Objective measurements completed on examination: See above findings.       [facility] Adult PT Treatment/Exercise - 01/03/21 0001      Exercises   Exercises Shoulder      Shoulder Exercises: Supine   External Rotation Left;10 reps    External Rotation Weight (lbs) 1    External Rotation Limitations Too weak to do active ER with red standing or no wt sidelying; done with elbow at side and at 90 deg ABD    Flexion Left;5 reps    Shoulder Flexion Weight (lbs) 1      Shoulder Exercises: Standing   Internal Rotation Left;10 reps    Theraband Level (Shoulder Internal Rotation) Level 3 (Green)    Flexion Strengthening    Shoulder Flexion Weight (lbs) 1    Flexion Limitations too painful    ABduction Left;5 reps    Shoulder ABduction Weight (lbs) 1    ABduction Limitations painfree range      Shoulder Exercises: Stretch   Other Shoulder Stretches doorway at 90 and 120 only mild stretch and more on right side                    PT Short Term  Goals - 01/03/21 1337      PT SHORT TERM GOAL #1   Title Ind with initial HEP    Time 3    Period Weeks    Status New    Target Date 01/24/21             PT Long Term Goals - 01/03/21 1337      PT LONG TERM GOAL #1   Title  Patient to report improved functional use of shoulder with ADLS by 84% or more.    Time 6    Period Weeks    Status New    Target Date 02/14/21      PT LONG TERM GOAL #2   Title Patient to demo improved left shoulder strength to 4+/5 and report 50% improvement with ADLS.    Time 6    Period Weeks    Status New      PT LONG TERM GOAL #3   Title Patient able to sleep 5 hours without waking due to shoulder pain.    Time 6    Period Weeks    Status New                  Plan - 01/03/21 1322    Clinical Impression Statement Patient presents with a long h/o of left shoulder pain beginning about 5 years ago when he lifted his full body weight by pushing through bil arms. Since that time patient has had intermittent pain and progressive decline in strength. He now is unable to lift light weight in flexion or ABDuction and has pain in all planes except ext with resisted shoulder movement. He has functional ROM in left shoulder but is tight in end ranges of all planes. Posturally he is good. He works out  3 days a week. 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 Additionally, he has nagging pain with sleeping no matter his position. He will benefit from PT to address these deficits.    Personal Factors and Comorbidities Finances;Comorbidity 3+;Time since onset of injury/illness/exacerbation;Profession    Comorbidities HTN, bil TKR, Rt RCR, multiple abdominal surgeries and hernia repairs    Examination-Activity Limitations Lift;Reach Overhead    Stability/Clinical Decision Making Stable/Uncomplicated    Clinical Decision Making Low    Rehab Potential Good    PT Frequency 2x / week    PT Duration 6 weeks    PT Treatment/Interventions ADLs/Self Care Home Management;Electrical Stimulation;Iontophoresis 4mg /ml Dexamethasone;Moist Heat;Therapeutic exercise;Therapeutic activities;Patient/family education;Neuromuscular re-education;Manual techniques;Dry  needling;Taping    PT Next Visit Plan Review HEP and progress; left shoulder strengthening, post shoulder strength; subscap release    PT Home Exercise Plan [email or web address]    Consulted and Agree with Plan of Care Patient           Patient will benefit from skilled therapeutic intervention in order to improve the following deficits and impairments:  Increased muscle spasms,Pain,Decreased range of motion,Decreased strength,Impaired UE functional use  Visit Diagnosis: Chronic left shoulder pain - Plan: PT plan of care cert/re-cert  Muscle weakness (generalized) - Plan: PT plan of care cert/re-cert  Stiffness of left shoulder, not elsewhere classified - Plan: PT plan of care cert/re-cert     Problem List Patient Active Problem List   Diagnosis Date Noted  . Rotator cuff tear, left 01/02/2021  . Iron deficiency anemia 12/31/2020  . Insomnia 12/31/2020  . Erectile dysfunction 12/31/2020  . Folliculitis 13/24/4010  . Essential hypertension 07/05/2020  . Blood  loss anemia 07/05/2020  . Neoplasm of uncertain behavior of skin 07/05/2020  . Hyperlipidemia 07/05/2020  . Benign prostatic hyperplasia without lower urinary tract symptoms 07/05/2020  . Coronary artery disease involving native coronary artery of native heart without angina pectoris 07/05/2020  . Panniculitis 05/08/2020  . 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 Ventral hernia, recurrent 01/11/2019  . Basal cell carcinoma of abdomen 12/25/2015    Madelyn Flavors PT 01/03/2021, 1:50 PM  [facility] [place], [place], [address] Phone: [phone]   Fax:  [phone]  Name: Terry Acosta MRN: [id number] Date of Birth: 01/17/1951

## 2021-01-07 ENCOUNTER — Ambulatory Visit (INDEPENDENT_AMBULATORY_CARE_PROVIDER_SITE_OTHER): Payer: Medicare Other | Admitting: Physical Therapy

## 2021-01-07 ENCOUNTER — Other Ambulatory Visit: Payer: Self-pay

## 2021-01-07 ENCOUNTER — Encounter: Payer: Self-pay | Admitting: Physical Therapy

## 2021-01-07 DIAGNOSIS — M6281 Muscle weakness (generalized): Secondary | ICD-10-CM | POA: Diagnosis not present

## 2021-01-07 DIAGNOSIS — M25612 Stiffness of left shoulder, not elsewhere classified: Secondary | ICD-10-CM

## 2021-01-07 DIAGNOSIS — M25512 Pain in left shoulder: Secondary | ICD-10-CM | POA: Diagnosis present

## 2021-01-07 DIAGNOSIS — G8929 Other chronic pain: Secondary | ICD-10-CM

## 2021-01-07 NOTE — Therapy (Signed)
[name] [address] [place], [name], [address] Phone: [phone]   Fax:  [phone]  Physical Therapy Treatment  Patient Details  Name: Terry Acosta MRN: [id number] Date of Birth: 04/07/1951 Referring Provider (PT): Thomas Thekkekandam, MD   Encounter Date: 01/07/2021   PT End of Session - 01/07/21 0859    Visit Number 2    Date for PT Re-Evaluation 02/14/21    Authorization Type MCR    Progress Note Due on Visit 10    PT Start Time 0859    PT Stop Time 0930    PT Time Calculation (min) 31 min    Activity Tolerance Patient tolerated treatment well    Behavior During Therapy WFL for tasks assessed/performed           Past Medical History:  Diagnosis Date  . CAD (coronary artery disease)   . Colon polyps   . Diverticulitis   . GERD (gastroesophageal reflux disease)   . High cholesterol   . Hypertension     Past Surgical History:  Procedure Laterality Date  . ABDOMINAL PERINEAL BOWEL RESECTION     x 2  . HERNIA REPAIR     x 6  . KNEE ARTHROSCOPY Bilateral   . TONSILLECTOMY      There were no vitals filed for this visit.   Subjective Assessment - 01/07/21 0900    Subjective Patient 15 min late. He reports compliance with HEP. No pain but he does take pain pill before PT. Nagging pain at night.    Pertinent History HTN, bil TKR, Rt RCR, multiple abdominal surgeries and hernia repairs    Patient Stated Goals I want no pain, I want strength back and get up to vertical position from playing with grandchild    Currently in Pain? No/denies                             [facility] Adult PT Treatment/Exercise - 01/07/21 0001      Shoulder Exercises: Supine   Horizontal ABduction Left;10 reps    Horizontal ABduction Weight (lbs) 2    Horizontal ABduction Limitations increase wt next time    External Rotation 20 reps    External Rotation Weight (lbs) 2    External Rotation Limitations  at 90 deg ABD and at side 10 each    Flexion 20 reps    Shoulder Flexion Weight (lbs) 2    Flexion Limitations 1st set with 1#    Diagonals Left;10 reps    Diagonals Weight (lbs) 2    Diagonals Limitations D2    Other Supine Exercises yellow band around wrists and bil shoulder flex x 10      Shoulder Exercises: Prone   Horizontal ABduction 1 Left;20 reps;Weights    Horizontal ABduction 1 Weight (lbs) 1    Horizontal ABduction 1 Limitations first set no wt    Horizontal ABduction 2 Left;20 reps;Weights    Horizontal ABduction 2 Weight (lbs) 1    Horizontal ABduction 2 Limitations first set no wt      Shoulder Exercises: Standing   Protraction 10 reps    Protraction Limitations military wall pushup plus    Internal Rotation 20 reps    Theraband Level (Shoulder Internal Rotation) Level 3 (Green)    Flexion Left;10 reps    Flexion Limitations 1# too difficult    ABduction Left;10 reps;Weights    Shoulder ABduction Weight (lbs) 1      ABduction Limitations painfree range    Diagonals Left;10 reps    Theraband Level (Shoulder Diagonals) Level 1 (Yellow)    Diagonals Limitations some compensation in trunk and upper traps; done from hip; too difficult from foot    Other Standing Exercises shoulder arcs on wall front to back with towel x 10    Other Standing Exercises bil serratus push with cross red band around back                  PT Education - 01/07/21 0929    Education Details HEP progressed    Person(s) Educated Patient    Methods Explanation;Demonstration;Handout    Comprehension Verbalized understanding;Returned demonstration            PT Short Term Goals - 01/03/21 1337      PT SHORT TERM GOAL #1   Title Ind with initial HEP    Time 3    Period Weeks    Status New    Target Date 01/24/21             PT Long Term Goals - 01/03/21 1337      PT LONG TERM GOAL #1   Title Patient to report improved functional use of shoulder with ADLS by 60% or more.     Time 6    Period Weeks    Status New    Target Date 02/14/21      PT LONG TERM GOAL #2   Title Patient to demo improved left shoulder strength to 4+/5 and report 50% improvement with ADLS.    Time 6    Period Weeks    Status New      PT LONG TERM GOAL #3   Title Patient able to sleep 5 hours without waking due to shoulder pain.    Time 6    Period Weeks    Status New                 Plan - 01/07/21 1043    Clinical Impression Statement Patient returns for first f/u visit and was 15 min late. We reviewed HEP and added/progressed TE as tolerated. He did well today with minimal pain. No goals met as only second visit.    Comorbidities HTN, bil TKR, Rt RCR, multiple abdominal surgeries and hernia repairs    Examination-Activity Limitations Lift;Reach Overhead    PT Frequency 2x / week    PT Duration 6 weeks    PT Treatment/Interventions ADLs/Self Care Home Management;Electrical Stimulation;Iontophoresis 4mg/ml Dexamethasone;Moist Heat;Therapeutic exercise;Therapeutic activities;Patient/family education;Neuromuscular re-education;Manual techniques;Dry needling;Taping    PT Next Visit Plan left shoulder strengthening, post shoulder strength; subscap release    PT Home Exercise Plan [email or web address]    Consulted and Agree with Plan of Care Patient           Patient will benefit from skilled therapeutic intervention in order to improve the following deficits and impairments:  Increased muscle spasms,Pain,Decreased range of motion,Decreased strength,Impaired UE functional use  Visit Diagnosis: Chronic left shoulder pain  Muscle weakness (generalized)  Stiffness of left shoulder, not elsewhere classified     Problem List Patient Active Problem List   Diagnosis Date Noted  . Rotator cuff tear, left 01/02/2021  . Iron deficiency anemia 12/31/2020  . Insomnia 12/31/2020  . Erectile dysfunction 12/31/2020  . Folliculitis 11/13/2020  . Essential hypertension 07/05/2020  .  Blood loss anemia 07/05/2020  . Neoplasm of uncertain behavior of skin 07/05/2020  . Hyperlipidemia 07/05/2020  . Benign prostatic   hyperplasia without lower urinary tract symptoms 07/05/2020  . Coronary artery disease involving native coronary artery of native heart without angina pectoris 07/05/2020  . Panniculitis 05/08/2020  . 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 Ventral hernia, recurrent 01/11/2019  . Basal cell carcinoma of abdomen 12/25/2015    Julie Riddles PT 01/07/2021, 10:48 AM  [facility] [address] [place], , [address] Phone: [phone]   Fax:  [phone]  Name: Xaine Kolodny MRN: [id number] Date of Birth: 02/17/1951   

## 2021-01-07 NOTE — Patient Instructions (Signed)
Access Code: [email or web address] URL: [email or web address] Date: 01/07/2021 Prepared by: Almyra Free  Exercises Supine Shoulder External Rotation with Dumbbell - 1 x daily - 7 x weekly - 1-3 sets - 10 reps Supine Shoulder Flexion with Free Weight - 1 x daily - 7 x weekly - 1-33 sets - 10 reps Standing Single Arm Shoulder Abduction with Dumbbell - Thumb Up - 1 x daily - 7 x weekly - 1-3 sets - 10 reps Standing Shoulder Internal Rotation with Anchored Resistance - 1 x daily - 3 x weekly - 1-3 sets - 12 reps Prone Single Arm Shoulder Horizontal Abduction with Dumbbell - Palm Down - 1 x daily - 3-4 x weekly - 1-3 sets - 10 reps

## 2021-01-09 ENCOUNTER — Ambulatory Visit (INDEPENDENT_AMBULATORY_CARE_PROVIDER_SITE_OTHER): Payer: Medicare Other | Admitting: Physical Therapy

## 2021-01-09 ENCOUNTER — Other Ambulatory Visit: Payer: Self-pay

## 2021-01-09 DIAGNOSIS — G8929 Other chronic pain: Secondary | ICD-10-CM | POA: Diagnosis not present

## 2021-01-09 DIAGNOSIS — M25612 Stiffness of left shoulder, not elsewhere classified: Secondary | ICD-10-CM

## 2021-01-09 DIAGNOSIS — M6281 Muscle weakness (generalized): Secondary | ICD-10-CM

## 2021-01-09 DIAGNOSIS — M25512 Pain in left shoulder: Secondary | ICD-10-CM

## 2021-01-09 LAB — CBC WITH DIFFERENTIAL/PLATELET
Absolute Monocytes: 439 cells/uL (ref 200–950)
Basophils Absolute: 97 cells/uL (ref 0–200)
Basophils Relative: 1.7 %
Eosinophils Absolute: 325 cells/uL (ref 15–500)
Eosinophils Relative: 5.7 %
HCT: 44.9 % (ref 38.5–50.0)
Hemoglobin: 15.1 g/dL (ref 13.2–17.1)
Lymphs Abs: 1659 cells/uL (ref 850–3900)
MCH: 30 pg (ref 27.0–33.0)
MCHC: 33.6 g/dL (ref 32.0–36.0)
MCV: 89.3 fL (ref 80.0–100.0)
MPV: 11.4 fL (ref 7.5–12.5)
Monocytes Relative: 7.7 %
Neutro Abs: 3181 cells/uL (ref 1500–7800)
Neutrophils Relative %: 55.8 %
Platelets: 199 10*3/uL (ref 140–400)
RBC: 5.03 10*6/uL (ref 4.20–5.80)
RDW: 13.5 % (ref 11.0–15.0)
Total Lymphocyte: 29.1 %
WBC: 5.7 10*3/uL (ref 3.8–10.8)

## 2021-01-09 LAB — IRON,TIBC AND FERRITIN PANEL
%SAT: 22 % (calc) (ref 20–48)
Ferritin: 32 ng/mL (ref 24–380)
Iron: 74 ug/dL (ref 50–180)
TIBC: 332 mcg/dL (calc) (ref 250–425)

## 2021-01-09 NOTE — Therapy (Signed)
[place], [place], [address] Phone: [phone]   Fax:  [phone]  Physical Therapy Treatment  Patient Details  Name: Terry Acosta MRN: [id number] Date of Birth: 09/29/1951 Referring Provider (PT): Aundria Mems, MD   Encounter Date: 01/09/2021   PT End of Session - 01/09/21 1228    Visit Number 3    Date for PT Re-Evaluation 02/14/21    Progress Note Due on Visit 10    PT Start Time 1147    PT Stop Time 1225    PT Time Calculation (min) 38 min    Activity Tolerance Patient tolerated treatment well    Behavior During Therapy [facility] for tasks assessed/performed           Past Medical History:  Diagnosis Date  . CAD (coronary artery disease)   . Colon polyps   . Diverticulitis   . GERD (gastroesophageal reflux disease)   . High cholesterol   . Hypertension     Past Surgical History:  Procedure Laterality Date  . ABDOMINAL PERINEAL BOWEL RESECTION     x 2  . HERNIA REPAIR     x 6  . KNEE ARTHROSCOPY Bilateral   . TONSILLECTOMY      There were no vitals filed for this visit.   Subjective Assessment - 01/09/21 1148    Subjective Pt states "I can move OK but anything with weights I'm not worth a darn"    Pertinent History HTN, bil TKR, Rt RCR, multiple abdominal surgeries and hernia repairs    Patient Stated Goals I want no pain, I want strength back and get up to vertical position from playing with grandchild    Currently in Pain? No/denies                             [facility] Adult PT Treatment/Exercise - 01/09/21 0001      Shoulder Exercises: Supine   Horizontal ABduction Left;20 reps    Horizontal ABduction Weight (lbs) 2    Horizontal ABduction Limitations increased wt next week    External Rotation Weight (lbs) 2    External Rotation Limitations at 90 degrees ABD x 10    Flexion Left;20 reps    Shoulder Flexion Weight (lbs) 2    Diagonals Left;20  reps    Diagonals Weight (lbs) 2    Diagonals Limitations D2    Other Supine Exercises yellow TB x 20      Shoulder Exercises: Sidelying   External Rotation Strengthening;Left;12 reps    External Rotation Limitations able to get to neutral      Shoulder Exercises: Standing   Protraction 12 reps    Protraction Limitations wall push up plus    Internal Rotation 20 reps    Theraband Level (Shoulder Internal Rotation) Level 3 (Green)    Flexion Left;10 reps    Shoulder Flexion Weight (lbs) 1    ABduction Left;10 reps    Shoulder ABduction Weight (lbs) 1    ABduction Limitations pain free range    Other Standing Exercises serratus wall slide with foam roall x 12    Other Standing Exercises resisted hug x 10 with red TB      Shoulder Exercises: ROM/Strengthening   UBE (Upper Arm Bike) level 1 x 4 mins alt fwd/bkwd                    PT Short Term  Goals - 01/03/21 1337      PT SHORT TERM GOAL #1   Title Ind with initial HEP    Time 3    Period Weeks    Status New    Target Date 01/24/21             PT Long Term Goals - 01/03/21 1337      PT LONG TERM GOAL #1   Title Patient to report improved functional use of shoulder with ADLS by 28% or more.    Time 6    Period Weeks    Status New    Target Date 02/14/21      PT LONG TERM GOAL #2   Title Patient to demo improved left shoulder strength to 4+/5 and report 50% improvement with ADLS.    Time 6    Period Weeks    Status New      PT LONG TERM GOAL #3   Title Patient able to sleep 5 hours without waking due to shoulder pain.    Time 6    Period Weeks    Status New                 Plan - 01/09/21 1230    Clinical Impression Statement Pt able to tolerate sidelying ER this session. Continues with difficulty with standing shoulder flexion and abduction with weight    PT Next Visit Plan continue Lt shoulder strength with weights, posterior shoulder strength    PT Home Exercise Plan [email or web address]     Consulted and Agree with Plan of Care Patient           Patient will benefit from skilled therapeutic intervention in order to improve the following deficits and impairments:     Visit Diagnosis: Chronic left shoulder pain  Muscle weakness (generalized)  Stiffness of left shoulder, not elsewhere classified     Problem List Patient Active Problem List   Diagnosis Date Noted  . Rotator cuff tear, left 01/02/2021  . Iron deficiency anemia 12/31/2020  . Insomnia 12/31/2020  . Erectile dysfunction 12/31/2020  . Folliculitis 31/51/7616  . Essential hypertension 07/05/2020  . Blood loss anemia 07/05/2020  . Neoplasm of uncertain behavior of skin 07/05/2020  . Hyperlipidemia 07/05/2020  . Benign prostatic hyperplasia without lower urinary tract symptoms 07/05/2020  . Coronary artery disease involving native coronary artery of native heart without angina pectoris 07/05/2020  . Panniculitis 05/08/2020  . 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 Ventral hernia, recurrent 01/11/2019  . Basal cell carcinoma of abdomen 12/25/2015   Jeralynn Vaquera, PT  Olliver Boyadjian 01/09/2021, 12:34 PM  [facility] [place], [place], [address] Phone: [phone]   Fax:  [phone]  Name: Terry Acosta MRN: [id number] Date of Birth: 12-22-1950

## 2021-01-15 ENCOUNTER — Ambulatory Visit (INDEPENDENT_AMBULATORY_CARE_PROVIDER_SITE_OTHER): Payer: Medicare Other | Admitting: Physical Therapy

## 2021-01-15 ENCOUNTER — Other Ambulatory Visit: Payer: Self-pay

## 2021-01-15 DIAGNOSIS — M25512 Pain in left shoulder: Secondary | ICD-10-CM | POA: Diagnosis present

## 2021-01-15 DIAGNOSIS — G8929 Other chronic pain: Secondary | ICD-10-CM

## 2021-01-15 DIAGNOSIS — M6281 Muscle weakness (generalized): Secondary | ICD-10-CM | POA: Diagnosis not present

## 2021-01-15 DIAGNOSIS — M25612 Stiffness of left shoulder, not elsewhere classified: Secondary | ICD-10-CM | POA: Diagnosis not present

## 2021-01-15 NOTE — Therapy (Signed)
[place], [place], [address] Phone: [phone]   Fax:  [phone]  Physical Therapy Treatment  Patient Details  Name: Terry Acosta MRN: [id number] Date of Birth: 06-10-1951 Referring Provider (PT): Aundria Mems, MD   Encounter Date: 01/15/2021   PT End of Session - 01/15/21 0928    Visit Number 4    Date for PT Re-Evaluation 02/14/21    Progress Note Due on Visit 10    PT Start Time 0845    PT Stop Time 0926    PT Time Calculation (min) 41 min    Activity Tolerance Patient tolerated treatment well    Behavior During Therapy [facility] for tasks assessed/performed           Past Medical History:  Diagnosis Date  . CAD (coronary artery disease)   . Colon polyps   . Diverticulitis   . GERD (gastroesophageal reflux disease)   . High cholesterol   . Hypertension     Past Surgical History:  Procedure Laterality Date  . ABDOMINAL PERINEAL BOWEL RESECTION     x 2  . HERNIA REPAIR     x 6  . KNEE ARTHROSCOPY Bilateral   . TONSILLECTOMY      There were no vitals filed for this visit.   Subjective Assessment - 01/15/21 0849    Subjective Pt states "I am a little sore from working around the house"    Currently in Pain? Yes    Pain Score 2     Pain Location Shoulder    Pain Orientation Left    Pain Descriptors / Indicators Sore    Pain Type Chronic pain                             [facility] Adult PT Treatment/Exercise - 01/15/21 0001      Shoulder Exercises: Supine   Horizontal ABduction Left;20 reps    Horizontal ABduction Weight (lbs) 3    Flexion Left;20 reps    Shoulder Flexion Weight (lbs) 3      Shoulder Exercises: Sidelying   External Rotation Strengthening;Left;20 reps    External Rotation Weight (lbs) 2      Shoulder Exercises: Standing   Internal Rotation 20 reps    Theraband Level (Shoulder Internal Rotation) Level 3 (Green)    Flexion  Strengthening;Left;20 reps    Shoulder Flexion Weight (lbs) 2# first 10, then 1#    ABduction Left;20 reps    Shoulder ABduction Weight (lbs) 2# first 10, then 1#    ABduction Limitations pain free range    Diagonals Left;12 reps    Theraband Level (Shoulder Diagonals) Level 2 (Red)    Diagonals Limitations cues for posture    Other Standing Exercises serratus wall slide foam roll x 15    Other Standing Exercises resisted hug red TB x 20      Shoulder Exercises: Therapy Ball   Other Therapy Ball Exercises rhythmic stabilzation with ball on wall 2 x 30 sec      Shoulder Exercises: ROM/Strengthening   UBE (Upper Arm Bike) level 3 x 4 mins alt fwd/bkwd                    PT Short Term Goals - 01/03/21 1337      PT SHORT TERM GOAL #1   Title Ind with initial HEP    Time 3    Period Weeks  Status New    Target Date 01/24/21             PT Long Term Goals - 01/03/21 1337      PT LONG TERM GOAL #1   Title Patient to report improved functional use of shoulder with ADLS by 24% or more.    Time 6    Period Weeks    Status New    Target Date 02/14/21      PT LONG TERM GOAL #2   Title Patient to demo improved left shoulder strength to 4+/5 and report 50% improvement with ADLS.    Time 6    Period Weeks    Status New      PT LONG TERM GOAL #3   Title Patient able to sleep 5 hours without waking due to shoulder pain.    Time 6    Period Weeks    Status New                 Plan - 01/15/21 8250    Clinical Impression Statement Pt able to progress weights with all exercises today. Continues with difficulty with shoulder flexion and abduction, improving ER    PT Next Visit Plan progress wt bearing exercises and stabilization    PT Home Exercise Plan [facility]           Patient will benefit from skilled therapeutic intervention in order to improve the following deficits and impairments:     Visit Diagnosis: Chronic left shoulder pain  Muscle  weakness (generalized)  Stiffness of left shoulder, not elsewhere classified     Problem List Patient Active Problem List   Diagnosis Date Noted  . Rotator cuff tear, left 01/02/2021  . Iron deficiency anemia 12/31/2020  . Insomnia 12/31/2020  . Erectile dysfunction 12/31/2020  . Folliculitis 03/70/4888  . Essential hypertension 07/05/2020  . Blood loss anemia 07/05/2020  . Neoplasm of uncertain behavior of skin 07/05/2020  . Hyperlipidemia 07/05/2020  . Benign prostatic hyperplasia without lower urinary tract symptoms 07/05/2020  . Coronary artery disease involving native coronary artery of native heart without angina pectoris 07/05/2020  . Panniculitis 05/08/2020  . 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 Ventral hernia, recurrent 01/11/2019  . Basal cell carcinoma of abdomen 12/25/2015   Hansel Devan, PT  Anaih Brander 01/15/2021, 9:29 AM  [facility] [place], [place], [address] Phone: [phone]   Fax:  [phone]  Name: Fumio Vandam MRN: [id number] Date of Birth: 1951/05/30

## 2021-01-17 ENCOUNTER — Other Ambulatory Visit: Payer: Self-pay

## 2021-01-17 ENCOUNTER — Ambulatory Visit (INDEPENDENT_AMBULATORY_CARE_PROVIDER_SITE_OTHER): Payer: Medicare Other | Admitting: Physical Therapy

## 2021-01-17 ENCOUNTER — Encounter: Payer: Self-pay | Admitting: Physical Therapy

## 2021-01-17 DIAGNOSIS — M25512 Pain in left shoulder: Secondary | ICD-10-CM | POA: Diagnosis present

## 2021-01-17 DIAGNOSIS — G8929 Other chronic pain: Secondary | ICD-10-CM

## 2021-01-17 DIAGNOSIS — M6281 Muscle weakness (generalized): Secondary | ICD-10-CM

## 2021-01-17 DIAGNOSIS — M25612 Stiffness of left shoulder, not elsewhere classified: Secondary | ICD-10-CM

## 2021-01-17 NOTE — Therapy (Signed)
[place], [place], [address] Phone: [phone]   Fax:  [phone]  Physical Therapy Treatment  Patient Details  Name: Terry Acosta MRN: [id number] Date of Birth: 12-19-1950 Referring Provider (PT): Aundria Mems, MD   Encounter Date: 01/17/2021   PT End of Session - 01/17/21 0851    Visit Number 5    Date for PT Re-Evaluation 02/14/21    Authorization Type MCR    PT Start Time 0851    PT Stop Time 0933    PT Time Calculation (min) 42 min    Activity Tolerance Patient tolerated treatment well    Behavior During Therapy [facility] for tasks assessed/performed           Past Medical History:  Diagnosis Date  . CAD (coronary artery disease)   . Colon polyps   . Diverticulitis   . GERD (gastroesophageal reflux disease)   . High cholesterol   . Hypertension     Past Surgical History:  Procedure Laterality Date  . ABDOMINAL PERINEAL BOWEL RESECTION     x 2  . HERNIA REPAIR     x 6  . KNEE ARTHROSCOPY Bilateral   . TONSILLECTOMY      There were no vitals filed for this visit.   Subjective Assessment - 01/17/21 0851    Subjective Terry Acosta reports he picked up his grandson ( 33#) and had a twinge in the shoulder    Patient Stated Goals I want no pain, I want strength back and get up to vertical position from playing with grandchild    Currently in Pain? Yes    Pain Score 1     Pain Location Shoulder    Pain Orientation Left    Pain Descriptors / Indicators Dull    Pain Type Chronic pain                             [facility] Adult PT Treatment/Exercise - 01/17/21 0001      Shoulder Exercises: Supine   Flexion Strengthening;Both;20 reps;Weights    Shoulder Flexion Weight (lbs) 5    Flexion Limitations CW/CCW circles in 90 degrees flexion      Shoulder Exercises: Prone   Other Prone Exercises modified plank on edge of mat with shoulder taps      Shoulder  Exercises: Sidelying   Other Sidelying Exercises 2x10 with 2# Lt shoulder, 5# Rt  CARs      Shoulder Exercises: Standing   External Rotation Both;Strengthening;10 reps   arms at sides, then 2x10 arms abducted 2x10   Flexion Limitations Lt UE overhead holds 4x30sec with 5# , stabilizing while stepping in different directions    Other Standing Exercises 2x10 arms abducted to 90, reaching and rotating palms,  10 serratus reach FWD with elbows bent, into ER and then overhead reach.      Shoulder Exercises: ROM/Strengthening   UBE (Upper Arm Bike) level 3 x 4 mins alt fwd/bkwd    Lat Pull 3 plate    Lat Pull Limitations 2x10 VC for form      Shoulder Exercises: Stretch   Other Shoulder Stretches hooklying scarecrow/goal post, then overhead                    PT Short Term Goals - 01/03/21 1337      PT SHORT TERM GOAL #1   Title Ind with initial HEP    Time  3    Period Weeks    Status New    Target Date 01/24/21             PT Long Term Goals - 01/03/21 1337      PT LONG TERM GOAL #1   Title Patient to report improved functional use of shoulder with ADLS by 79% or more.    Time 6    Period Weeks    Status New    Target Date 02/14/21      PT LONG TERM GOAL #2   Title Patient to demo improved left shoulder strength to 4+/5 and report 50% improvement with ADLS.    Time 6    Period Weeks    Status New      PT LONG TERM GOAL #3   Title Patient able to sleep 5 hours without waking due to shoulder pain.    Time 6    Period Weeks    Status New                 Plan - 01/17/21 0949    Clinical Impression Statement Terry Acosta did well with treatment today, performed a lot of shoulder stabilization work in elevated positions to prep for holding a camera up.  He did fatigue with these.  Have him adding lat pull downs to his gym routine.    Rehab Potential Good    PT Frequency 2x / week    PT Treatment/Interventions ADLs/Self Care Home Management;Electrical  Stimulation;Iontophoresis 4mg /ml Dexamethasone;Moist Heat;Therapeutic exercise;Therapeutic activities;Patient/family education;Neuromuscular re-education;Manual techniques;Dry needling;Taping    PT Next Visit Plan progress wt bearing exercises and stabilization    PT Home Exercise Plan [email or web address]    Consulted and Agree with Plan of Care Patient           Patient will benefit from skilled therapeutic intervention in order to improve the following deficits and impairments:  Increased muscle spasms,Pain,Decreased range of motion,Decreased strength,Impaired UE functional use  Visit Diagnosis: Chronic left shoulder pain  Muscle weakness (generalized)  Stiffness of left shoulder, not elsewhere classified     Problem List Patient Active Problem List   Diagnosis Date Noted  . Rotator cuff tear, left 01/02/2021  . Iron deficiency anemia 12/31/2020  . Insomnia 12/31/2020  . Erectile dysfunction 12/31/2020  . Folliculitis 02/40/9735  . Essential hypertension 07/05/2020  . Blood loss anemia 07/05/2020  . Neoplasm of uncertain behavior of skin 07/05/2020  . Hyperlipidemia 07/05/2020  . Benign prostatic hyperplasia without lower urinary tract symptoms 07/05/2020  . Coronary artery disease involving native coronary artery of native heart without angina pectoris 07/05/2020  . Panniculitis 05/08/2020  . 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 Ventral hernia, recurrent 01/11/2019  . Basal cell carcinoma of abdomen 12/25/2015    Jeral Pinch PT  01/17/2021, 9:52 AM  [facility] [place], [place], [address] Phone: [phone]   Fax:  [phone]  Name: Terry Acosta MRN: [id number] Date of Birth: [birth year]

## 2021-01-21 ENCOUNTER — Ambulatory Visit (INDEPENDENT_AMBULATORY_CARE_PROVIDER_SITE_OTHER): Payer: Medicare Other | Admitting: Rehabilitative and Restorative Service Providers"

## 2021-01-21 ENCOUNTER — Other Ambulatory Visit: Payer: Self-pay

## 2021-01-21 ENCOUNTER — Encounter: Payer: Self-pay | Admitting: Rehabilitative and Restorative Service Providers"

## 2021-01-21 DIAGNOSIS — M6281 Muscle weakness (generalized): Secondary | ICD-10-CM

## 2021-01-21 DIAGNOSIS — M25612 Stiffness of left shoulder, not elsewhere classified: Secondary | ICD-10-CM | POA: Diagnosis not present

## 2021-01-21 DIAGNOSIS — G8929 Other chronic pain: Secondary | ICD-10-CM | POA: Diagnosis not present

## 2021-01-21 DIAGNOSIS — M25512 Pain in left shoulder: Secondary | ICD-10-CM

## 2021-01-21 NOTE — Patient Instructions (Signed)
Access Code: [email or web address]: [email or web address]: 02/21/2022Prepared by: Brax Walen HoltExercises  Supine Shoulder External Rotation with Dumbbell - 1 x daily - 7 x weekly - 1-3 sets - 10 reps  Supine Shoulder Flexion with Free Weight - 1 x daily - 7 x weekly - 1-33 sets - 10 reps  Standing Single Arm Shoulder Abduction with Dumbbell - Thumb Up - 1 x daily - 7 x weekly - 1-3 sets - 10 reps  Standing Shoulder Internal Rotation with Anchored Resistance - 1 x daily - 3 x weekly - 1-3 sets - 12 reps  Prone Single Arm Shoulder Horizontal Abduction with Dumbbell - Palm Down - 1 x daily - 3-4 x weekly - 1-3 sets - 10 reps  Doorway Pec Stretch at 60 Degrees Abduction - 3 x daily - 7 x weekly - 3 reps - 1 sets  Doorway Pec Stretch at 90 Degrees Abduction - 3 x daily - 7 x weekly - 3 reps - 1 sets - 30 seconds hold  Doorway Pec Stretch at 120 Degrees Abduction - 3 x daily - 7 x weekly - 3 reps - 1 sets - 30 second hold hold  Standing Wall [employer] with [employer] - 2 x daily - 7 x weekly - 1 sets - 8-10 reps - 2-3 sec hold  Standing Wall Ball Circles in [employer] with Plyo Ball - 2 x daily - 7 x weekly - 1-2 sets - 8-10 reps - 2-3 sec hold  Plank with Shoulder Flexion on Counter - 2 x daily - 7 x weekly - 1 sets - 5-10 reps - 3-5 sec hold

## 2021-01-21 NOTE — Therapy (Signed)
[place], [place], [address] Phone: [phone]   Fax:  [phone]  Physical Therapy Treatment  Patient Details  Name: Terry Acosta MRN: [id number] Date of Birth: 11/12/1951 Referring Provider (PT): Aundria Mems, MD   Encounter Date: 01/21/2021   PT End of Session - 01/21/21 1425    Visit Number 6    Date for PT Re-Evaluation 02/14/21    Authorization Type MCR    Progress Note Due on Visit 10    PT Start Time 1426    PT Stop Time 1509    PT Time Calculation (min) 43 min    Activity Tolerance Patient tolerated treatment well           Past Medical History:  Diagnosis Date  . CAD (coronary artery disease)   . Colon polyps   . Diverticulitis   . GERD (gastroesophageal reflux disease)   . High cholesterol   . Hypertension     Past Surgical History:  Procedure Laterality Date  . ABDOMINAL PERINEAL BOWEL RESECTION     x 2  . HERNIA REPAIR     x 6  . KNEE ARTHROSCOPY Bilateral   . TONSILLECTOMY      There were no vitals filed for this visit.   Subjective Assessment - 01/21/21 1426    Subjective Patient reports continued weakness. No pain. Feels he is getting a little stronger. Needs to be able to life a camera.    Currently in Pain? No/denies    Pain Score --   0-6/26 with certain movements   Pain Location Shoulder    Pain Orientation Left    Pain Descriptors / Indicators Dull    Pain Type Chronic pain    Pain Onset More than a month ago    Pain Frequency Intermittent    Aggravating Factors  flexioni and abduction with weight; sleeping on Lt side; certain movements    Pain Relieving Factors arm at side when sleeping                             [facility] Adult PT Treatment/Exercise - 01/21/21 0001      Shoulder Exercises: Seated   Other Seated Exercises bilat hands on ~ 8 inch ball for various shoulder movements flex/ext; elbow flex/ext; diagonals;  pushing ball overhead      Shoulder Exercises: Standing   Flexion Strengthening;Left;10 reps   eccentric lowering from ~ 80 deg 2# wt   ABduction Limitations scaption ~ 70 deg lowering 2# wt x 10 reps    Shoulder Elevation Strengthening;Left;10 reps    Shoulder Elevation Limitations lowering eccentrically ball on wall in flexion and abduction    Other Standing Exercises plank on counter alternating shd lift 10 reps x 2 sets      Shoulder Exercises: Therapy Ball   Other Therapy Ball Exercises bouncing ball on wall 1-2 min x 2 reps      Shoulder Exercises: ROM/Strengthening   UBE (Upper Arm Bike) level 5 x 4 mins alt fwd/bkwd      Shoulder Exercises: Body Blade   Flexion 30 seconds;3 reps    ABduction 30 seconds;3 reps    Other Body Blade Exercises overhead w/ bilat UE's 30 sec x 3 reps                  PT Education - 01/21/21 1514    Education Details HEP    Person(s)  Educated Patient    Methods Explanation;Demonstration;Tactile cues;Verbal cues;Handout    Comprehension Verbalized understanding;Returned demonstration;Verbal cues required;Tactile cues required            PT Short Term Goals - 01/03/21 1337      PT SHORT TERM GOAL #1   Title Ind with initial HEP    Time 3    Period Weeks    Status New    Target Date 01/24/21             PT Long Term Goals - 01/03/21 1337      PT LONG TERM GOAL #1   Title Patient to report improved functional use of shoulder with ADLS by 63% or more.    Time 6    Period Weeks    Status New    Target Date 02/14/21      PT LONG TERM GOAL #2   Title Patient to demo improved left shoulder strength to 4+/5 and report 50% improvement with ADLS.    Time 6    Period Weeks    Status New      PT LONG TERM GOAL #3   Title Patient able to sleep 5 hours without waking due to shoulder pain.    Time 6    Period Weeks    Status New                 Plan - 01/21/21 1435    Clinical Impression Statement Continues to  report weakness in Lt UE with lifting arm forward or to the side. Added strengthening and stabilization exercises with some fatigue with exercises.    Rehab Potential Good    PT Frequency 2x / week    PT Duration 6 weeks    PT Treatment/Interventions ADLs/Self Care Home Management;Electrical Stimulation;Iontophoresis 4mg /ml Dexamethasone;Moist Heat;Therapeutic exercise;Therapeutic activities;Patient/family education;Neuromuscular re-education;Manual techniques;Dry needling;Taping    PT Next Visit Plan progress wt bearing exercises and stabilization    PT Home Exercise Plan [email or web address]    Consulted and Agree with Plan of Care Patient           Patient will benefit from skilled therapeutic intervention in order to improve the following deficits and impairments:     Visit Diagnosis: Chronic left shoulder pain  Muscle weakness (generalized)  Stiffness of left shoulder, not elsewhere classified     Problem List Patient Active Problem List   Diagnosis Date Noted  . Rotator cuff tear, left 01/02/2021  . Iron deficiency anemia 12/31/2020  . Insomnia 12/31/2020  . Erectile dysfunction 12/31/2020  . Folliculitis 01/60/1093  . Essential hypertension 07/05/2020  . Blood loss anemia 07/05/2020  . Neoplasm of uncertain behavior of skin 07/05/2020  . Hyperlipidemia 07/05/2020  . Benign prostatic hyperplasia without lower urinary tract symptoms 07/05/2020  . Coronary artery disease involving native coronary artery of native heart without angina pectoris 07/05/2020  . Panniculitis 05/08/2020  . 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 Ventral hernia, recurrent 01/11/2019  . Basal cell carcinoma of abdomen 12/25/2015    Jayleon Mcfarlane Nilda Simmer PT, MPH  01/21/2021, 3:15 PM  [facility] [place], [place], [address] Phone: [phone]   Fax:  [phone]  Name: Terry Acosta MRN: [id number] Date of Birth: July 30, 1951

## 2021-01-24 ENCOUNTER — Encounter: Payer: Self-pay | Admitting: Rehabilitative and Restorative Service Providers"

## 2021-01-24 ENCOUNTER — Ambulatory Visit (INDEPENDENT_AMBULATORY_CARE_PROVIDER_SITE_OTHER): Payer: Medicare Other | Admitting: Rehabilitative and Restorative Service Providers"

## 2021-01-24 ENCOUNTER — Other Ambulatory Visit: Payer: Self-pay

## 2021-01-24 DIAGNOSIS — M25512 Pain in left shoulder: Secondary | ICD-10-CM

## 2021-01-24 DIAGNOSIS — M6281 Muscle weakness (generalized): Secondary | ICD-10-CM

## 2021-01-24 DIAGNOSIS — M25612 Stiffness of left shoulder, not elsewhere classified: Secondary | ICD-10-CM | POA: Diagnosis not present

## 2021-01-24 DIAGNOSIS — G8929 Other chronic pain: Secondary | ICD-10-CM

## 2021-01-24 NOTE — Patient Instructions (Addendum)
Bow and arrow  Stepping back as you pull band across chest  2-3 sets of 10 each side   Access Code: [email or web address]: [email or web address]: 02/24/2022Prepared by: Maximiano Lott HoltExercises  Supine Shoulder External Rotation with Dumbbell - 1 x daily - 7 x weekly - 1-3 sets - 10 reps  Supine Shoulder Flexion with Free Weight - 1 x daily - 7 x weekly - 1-33 sets - 10 reps  Standing Single Arm Shoulder Abduction with Dumbbell - Thumb Up - 1 x daily - 7 x weekly - 1-3 sets - 10 reps  Standing Shoulder Internal Rotation with Anchored Resistance - 1 x daily - 3 x weekly - 1-3 sets - 12 reps  Prone Single Arm Shoulder Horizontal Abduction with Dumbbell - Palm Down - 1 x daily - 3-4 x weekly - 1-3 sets - 10 reps  Doorway Pec Stretch at 60 Degrees Abduction - 3 x daily - 7 x weekly - 3 reps - 1 sets  Doorway Pec Stretch at 90 Degrees Abduction - 3 x daily - 7 x weekly - 3 reps - 1 sets - 30 seconds hold  Doorway Pec Stretch at 120 Degrees Abduction - 3 x daily - 7 x weekly - 3 reps - 1 sets - 30 second hold hold  Standing Wall [employer] with [employer] - 2 x daily - 7 x weekly - 1 sets - 8-10 reps - 2-3 sec hold  Standing Wall Ball Circles in [employer] with Plyo Ball - 2 x daily - 7 x weekly - 1-2 sets - 8-10 reps - 2-3 sec hold  Plank with Shoulder Flexion on Counter - 2 x daily - 7 x weekly - 1 sets - 5-10 reps - 3-5 sec hold  Seated Shoulder External Rotation in Abduction Supported with Resistance - 2 x daily - 7 x weekly - 2 sets - 5-10 reps - 3-5 sec hold  Shoulder Flexion Serratus Activation with Resistance - 2 x daily - 7 x weekly - 2-3 sets - 10 reps - 3-5 sec hold  Standing High Shoulder Row with Anchored Resistance - 2 x daily - 7 x weekly - 2-3 sets - 10 reps - 3 sec hold  Squatting High Shoulder Row with Resistance - 2 x daily - 7 x weekly - 2-3 sets - 10 reps - 3 sec hold

## 2021-01-24 NOTE — Therapy (Signed)
[facility] [place] [place], [place], [address] Phone: [phone]   Fax:  [phone]  Physical Therapy Treatment  Patient Details  Name: Terry Acosta MRN: [id number] Date of Birth: 12/15/1950 Referring Provider (PT): Aundria Mems, MD   Encounter Date: 01/24/2021   PT End of Session - 01/24/21 0848    Visit Number 7    Date for PT Re-Evaluation 02/14/21    Progress Note Due on Visit 10    PT Start Time 0845    PT Stop Time 0930    PT Time Calculation (min) 45 min    Activity Tolerance Patient tolerated treatment well           Past Medical History:  Diagnosis Date  . CAD (coronary artery disease)   . Colon polyps   . Diverticulitis   . GERD (gastroesophageal reflux disease)   . High cholesterol   . Hypertension     Past Surgical History:  Procedure Laterality Date  . ABDOMINAL PERINEAL BOWEL RESECTION     x 2  . HERNIA REPAIR     x 6  . KNEE ARTHROSCOPY Bilateral   . TONSILLECTOMY      There were no vitals filed for this visit.   Subjective Assessment - 01/24/21 0848    Subjective Shoulder is OK. Has a "little twinge" every once in a while - putting on shirt or coat; reaching back; certain positions.    Pertinent History HTN, bil TKR, Rt RCR, multiple abdominal surgeries and hernia repairs    Diagnostic tests xray: Lt A/C jt mild arthropathy    Patient Stated Goals I want no pain, I want strength back and get up to vertical position from playing with grandchild    Currently in Pain? No/denies    Pain Score 0-No pain                             [facility] Adult PT Treatment/Exercise - 01/24/21 0001      Shoulder Exercises: Standing   External Rotation Strengthening;Left;15 reps   sitting arm at 90 deg elbow 90 deg   Extension Strengthening;Both;20 reps;Theraband    Theraband Level (Shoulder Extension) Level 4 (Blue)    Row Strengthening;Both;20 reps;Theraband     Theraband Level (Shoulder Row) Level 4 (Blue)    Row Limitations bow and arrow stepping back - blue TB 10 reps x 3 sets each side    Retraction Limitations scapular depression blue TB pushing to floor - shd depression      Shoulder Exercises: ROM/Strengthening   UBE (Upper Arm Bike) level 5 x 4 mins alt fwd/bkwd      Shoulder Exercises: Stretch   Wall Stretch - Flexion 2 reps;30 seconds   hands over doorway   Other Shoulder Stretches three position doorway stretch 30 sec hold x 3 reps each position      Shoulder Exercises: Body Blade   Flexion 30 seconds;3 reps    ABduction 30 seconds;3 reps    Other Body Blade Exercises overhead w/ bilat UE's 30 sec x 3 reps                  PT Education - 01/24/21 0903    Education Details HEP    Person(s) Educated Patient    Methods Explanation;Demonstration;Tactile cues;Verbal cues;Handout    Comprehension Verbalized understanding;Returned demonstration;Verbal cues required;Tactile cues required  PT Short Term Goals - 01/03/21 1337      PT SHORT TERM GOAL #1   Title Ind with initial HEP    Time 3    Period Weeks    Status New    Target Date 01/24/21             PT Long Term Goals - 01/03/21 1337      PT LONG TERM GOAL #1   Title Patient to report improved functional use of shoulder with ADLS by 16% or more.    Time 6    Period Weeks    Status New    Target Date 02/14/21      PT LONG TERM GOAL #2   Title Patient to demo improved left shoulder strength to 4+/5 and report 50% improvement with ADLS.    Time 6    Period Weeks    Status New      PT LONG TERM GOAL #3   Title Patient able to sleep 5 hours without waking due to shoulder pain.    Time 6    Period Weeks    Status New                 Plan - 01/24/21 0850    Clinical Impression Statement Patient continues to demonstrate increased exercise endurance. He reports some improvement in functional activities. Continue to add exercises to  address weakness and functional deficits. Discussed machines to use at the gym and techniques to maximize benefit.    Rehab Potential Good    PT Frequency 2x / week    PT Duration 6 weeks    PT Treatment/Interventions ADLs/Self Care Home Management;Electrical Stimulation;Iontophoresis 4mg /ml Dexamethasone;Moist Heat;Therapeutic exercise;Therapeutic activities;Patient/family education;Neuromuscular re-education;Manual techniques;Dry needling;Taping    PT Next Visit Plan progress wt bearing exercises and stabilization; add prone series    PT Home Exercise Plan [email or web address]    Consulted and Agree with Plan of Care Patient           Patient will benefit from skilled therapeutic intervention in order to improve the following deficits and impairments:     Visit Diagnosis: Chronic left shoulder pain  Muscle weakness (generalized)  Stiffness of left shoulder, not elsewhere classified     Problem List Patient Active Problem List   Diagnosis Date Noted  . Rotator cuff tear, left 01/02/2021  . Iron deficiency anemia 12/31/2020  . Insomnia 12/31/2020  . Erectile dysfunction 12/31/2020  . Folliculitis 10/96/0454  . Essential hypertension 07/05/2020  . Blood loss anemia 07/05/2020  . Neoplasm of uncertain behavior of skin 07/05/2020  . Hyperlipidemia 07/05/2020  . Benign prostatic hyperplasia without lower urinary tract symptoms 07/05/2020  . Coronary artery disease involving native coronary artery of native heart without angina pectoris 07/05/2020  . Panniculitis 05/08/2020  . 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 Ventral hernia, recurrent 01/11/2019  . Basal cell carcinoma of abdomen 12/25/2015    Jadelyn Elks Nilda Simmer PT, MPH  01/24/2021, 9:31 AM  [facility] [place], [place], [address] Phone: [phone]   Fax:  [phone]  Name: Terry Acosta MRN: [id number] Date of Birth: 1951/04/05

## 2021-01-29 ENCOUNTER — Ambulatory Visit (INDEPENDENT_AMBULATORY_CARE_PROVIDER_SITE_OTHER): Payer: Medicare Other | Admitting: Rehabilitative and Restorative Service Providers"

## 2021-01-29 ENCOUNTER — Encounter: Payer: Self-pay | Admitting: Rehabilitative and Restorative Service Providers"

## 2021-01-29 ENCOUNTER — Other Ambulatory Visit: Payer: Self-pay

## 2021-01-29 DIAGNOSIS — M25612 Stiffness of left shoulder, not elsewhere classified: Secondary | ICD-10-CM | POA: Diagnosis not present

## 2021-01-29 DIAGNOSIS — M6281 Muscle weakness (generalized): Secondary | ICD-10-CM

## 2021-01-29 DIAGNOSIS — M25512 Pain in left shoulder: Secondary | ICD-10-CM

## 2021-01-29 DIAGNOSIS — G8929 Other chronic pain: Secondary | ICD-10-CM | POA: Diagnosis not present

## 2021-01-29 NOTE — Patient Instructions (Signed)
Access Code: [email or web address]: [email or web address]: 03/01/2022Prepared by: Bj Morlock HoltExercises  Seated Cervical Retraction - 2 x daily - 7 x weekly - 1-2 sets - 5-10 reps - 10 sec hold  Seated Scapular Retraction - 2 x daily - 7 x weekly - 1-2 sets - 10 reps - 10 sec hold  Shoulder External Rotation and Scapular Retraction - 2 x daily - 7 x weekly - 1 sets - 10 reps - 5 sec hold  Shoulder External Rotation in 45 Degrees Abduction - 2 x daily - 7 x weekly - 1-2 sets - 10 reps - 3 sec hold  Doorway Pec Stretch at 60 Degrees Abduction - 3 x daily - 7 x weekly - 3 reps - 1 sets  Doorway Pec Stretch at 90 Degrees Abduction - 3 x daily - 7 x weekly - 3 reps - 1 sets - 30 seconds hold  Doorway Pec Stretch at 120 Degrees Abduction - 3 x daily - 7 x weekly - 3 reps - 1 sets - 30 second hold hold  Standing Backward Shoulder Rolls - 2 x daily - 7 x weekly - 1 sets - 10 reps - 1-2 sec hold  Isometric Shoulder External Rotation in Abduction with Ball at Wall - 2 x daily - 7 x weekly - 1 sets - 5-10 reps - 30-60 sec hold

## 2021-01-29 NOTE — Therapy (Signed)
Terry Acosta, [place], [address] Phone: [phone]   Fax:  [phone]  Physical Therapy Treatment  Patient Details  Name: Terry Acosta MRN: [id number] Date of Birth: 03-Dec-1950 Referring Provider (PT): Aundria Mems, MD   Encounter Date: 01/29/2021   PT End of Session - 01/29/21 0841    Visit Number 8    Number of Visits 12    Date for PT Re-Evaluation 02/14/21    Authorization Type MCR    Progress Note Due on Visit 10    PT Start Time 9417    PT Stop Time 0930    PT Time Calculation (min) 46 min    Activity Tolerance Patient tolerated treatment well           Past Medical History:  Diagnosis Date  . CAD (coronary artery disease)   . Colon polyps   . Diverticulitis   . GERD (gastroesophageal reflux disease)   . High cholesterol   . Hypertension     Past Surgical History:  Procedure Laterality Date  . ABDOMINAL PERINEAL BOWEL RESECTION     x 2  . HERNIA REPAIR     x 6  . KNEE ARTHROSCOPY Bilateral   . TONSILLECTOMY      There were no vitals filed for this visit.   Subjective Assessment - 01/29/21 0843    Subjective OK. Has a "little twinge" every once in a while - putting on shirt or coat; reaching back; certain positions. found some machines at the gym similar to exercises we added here last visit.    Currently in Pain? No/denies    Pain Score 0-No pain              [facility] PT Assessment - 01/29/21 0001      Assessment   Medical Diagnosis nontraumatic complete tear of Lt Rotator Cuff    Referring Provider (PT) Aundria Mems, MD    Onset Date/Surgical Date 12/02/15    Hand Dominance Right    Next MD Visit 6 weeks      AROM   AROM Assessment Site Shoulder    Right/Left Shoulder Left    Left Shoulder Extension 48 Degrees    Left Shoulder Flexion 170 Degrees    Left Shoulder ABduction 170 Degrees    Left Shoulder External Rotation 84 Degrees       Strength   Left Shoulder Flexion 4+/5   nagging pain   Left Shoulder Extension 5/5    Left Shoulder ABduction 4/5   nagging   Left Shoulder Internal Rotation 4+/5   nagging   Left Shoulder External Rotation 4/5   nagging                        [facility] Adult PT Treatment/Exercise - 01/29/21 0001      Shoulder Exercises: Seated   External Rotation Strengthening;Left;20 reps;Weights   seated shoulder 90/elbow 90 deg moving into ER   External Rotation Weight (lbs) 1      Shoulder Exercises: Prone   Retraction Strengthening;10 reps   axial extension w/ arms in goal post position     Shoulder Exercises: Sidelying   External Rotation Strengthening;Left;20 reps;Weights    External Rotation Weight (lbs) 1    ABduction Strengthening;Left;20 reps;Weights    ABduction Weight (lbs) 1      Shoulder Exercises: Standing   Retraction Limitations scapular depression blue TB pushing to floor - shd depression  Other Standing Exercises plank on counter alternating shd lift 10 reps x 2 sets    Other Standing Exercises 2x10 arms abducted to 90, reaching and rotating palms,  10 serratus reach FWD with elbows bent, into ER and then overhead reach.      Shoulder Exercises: Therapy Ball   Other Therapy Ball Exercises ball on wall dorsum of hand - working on ER varying positions back to wall and turning to Rt - hand at waist then moving toward shoulder height    Other Therapy Ball Exercises throw and catch ball x 30      Shoulder Exercises: ROM/Strengthening   UBE (Upper Arm Bike) level 5 x 4 mins alt fwd/bkwd      Shoulder Exercises: Stretch   Other Shoulder Stretches three position doorway stretch 30 sec hold x 3 reps each position      Shoulder Exercises: Body Blade   Flexion 60 seconds;3 reps    ABduction 60 seconds;3 reps    Other Body Blade Exercises overhead w/ bilat UE's 30-60 sec x 3 reps      Manual Therapy   Manual therapy comments pt sidelying    Soft tissue mobilization  deep tissue work through the medial scapular border pt sidelying    Scapular Mobilization Lt    Other Manual Therapy manual traction through the long arm pt sidelying                  PT Education - 01/29/21 0910    Education Details HEP    Person(s) Educated Patient    Methods Explanation;Demonstration;Tactile cues;Verbal cues;Handout    Comprehension Verbalized understanding;Returned demonstration;Verbal cues required;Tactile cues required            PT Short Term Goals - 01/03/21 1337      PT SHORT TERM GOAL #1   Title Ind with initial HEP    Time 3    Period Weeks    Status New    Target Date 01/24/21             PT Long Term Goals - 01/03/21 1337      PT LONG TERM GOAL #1   Title Patient to report improved functional use of shoulder with ADLS by 35% or more.    Time 6    Period Weeks    Status New    Target Date 02/14/21      PT LONG TERM GOAL #2   Title Patient to demo improved left shoulder strength to 4+/5 and report 50% improvement with ADLS.    Time 6    Period Weeks    Status New      PT LONG TERM GOAL #3   Title Patient able to sleep 5 hours without waking due to shoulder pain.    Time 6    Period Weeks    Status New                 Plan - 01/29/21 0845    Clinical Impression Statement Continued gradual improvement with patient reporting slow gains in strength Rt UE. Challenged by some of the exercises last visit. Performing some of the exercises in clinic with greater ease. Note increasing strength with resistive testing Lt UE.    Rehab Potential Good    PT Frequency 2x / week    PT Duration 6 weeks    PT Treatment/Interventions ADLs/Self Care Home Management;Electrical Stimulation;Iontophoresis 4mg /ml Dexamethasone;Moist Heat;Therapeutic exercise;Therapeutic activities;Patient/family education;Neuromuscular re-education;Manual techniques;Dry needling;Taping    PT  Next Visit Plan progress wt bearing exercises and stabilization;  add prone series    PT Home Exercise Plan [email or web address]    Consulted and Agree with Plan of Care Patient           Patient will benefit from skilled therapeutic intervention in order to improve the following deficits and impairments:     Visit Diagnosis: Chronic left shoulder pain  Muscle weakness (generalized)  Stiffness of left shoulder, not elsewhere classified     Problem List Patient Active Problem List   Diagnosis Date Noted  . Rotator cuff tear, left 01/02/2021  . Iron deficiency anemia 12/31/2020  . Insomnia 12/31/2020  . Erectile dysfunction 12/31/2020  . Folliculitis 37/62/8315  . Essential hypertension 07/05/2020  . Blood loss anemia 07/05/2020  . Neoplasm of uncertain behavior of skin 07/05/2020  . Hyperlipidemia 07/05/2020  . Benign prostatic hyperplasia without lower urinary tract symptoms 07/05/2020  . Coronary artery disease involving native coronary artery of native heart without angina pectoris 07/05/2020  . Panniculitis 05/08/2020  . 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 Ventral hernia, recurrent 01/11/2019  . Basal cell carcinoma of abdomen 12/25/2015    Rooney Swails Nilda Simmer PT, MPH  01/29/2021, 9:33 AM  [facility] [place], [place], [address] Phone: [phone]   Fax:  [phone]  Name: Terry Acosta MRN: [id number] Date of Birth: 02-17-1951

## 2021-01-31 ENCOUNTER — Other Ambulatory Visit: Payer: Self-pay

## 2021-01-31 ENCOUNTER — Ambulatory Visit (INDEPENDENT_AMBULATORY_CARE_PROVIDER_SITE_OTHER): Payer: Medicare Other | Admitting: Rehabilitative and Restorative Service Providers"

## 2021-01-31 ENCOUNTER — Encounter: Payer: Self-pay | Admitting: Rehabilitative and Restorative Service Providers"

## 2021-01-31 DIAGNOSIS — G8929 Other chronic pain: Secondary | ICD-10-CM | POA: Diagnosis not present

## 2021-01-31 DIAGNOSIS — M25612 Stiffness of left shoulder, not elsewhere classified: Secondary | ICD-10-CM

## 2021-01-31 DIAGNOSIS — M6281 Muscle weakness (generalized): Secondary | ICD-10-CM

## 2021-01-31 DIAGNOSIS — M25512 Pain in left shoulder: Secondary | ICD-10-CM | POA: Diagnosis present

## 2021-01-31 NOTE — Therapy (Signed)
[place], [place], [address] Phone: [phone]   Fax:  [phone]  Physical Therapy Treatment  Patient Details  Name: Terry Acosta MRN: [id number] Date of Birth: 02/08/1951 Referring Provider (PT): Aundria Mems, MD   Encounter Date: 01/31/2021   PT End of Session - 01/31/21 0928    Visit Number 9    Number of Visits 12    Date for PT Re-Evaluation 02/14/21    Authorization Type MCR    PT Start Time 0928    PT Stop Time 5277    PT Time Calculation (min) 47 min    Activity Tolerance Patient tolerated treatment well           Past Medical History:  Diagnosis Date  . CAD (coronary artery disease)   . Colon polyps   . Diverticulitis   . GERD (gastroesophageal reflux disease)   . High cholesterol   . Hypertension     Past Surgical History:  Procedure Laterality Date  . ABDOMINAL PERINEAL BOWEL RESECTION     x 2  . HERNIA REPAIR     x 6  . KNEE ARTHROSCOPY Bilateral   . TONSILLECTOMY      There were no vitals filed for this visit.   Subjective Assessment - 01/31/21 0930    Subjective Thinks he slept on his shoulder last night. Has some pain today.    Currently in Pain? Yes    Pain Score 2     Pain Location Shoulder    Pain Orientation Left    Pain Descriptors / Indicators Aching    Pain Type Chronic pain                             [facility] Adult PT Treatment/Exercise - 01/31/21 0001      Shoulder Exercises: Supine   Other Supine Exercises PNF resisted X 10      Shoulder Exercises: Seated   External Rotation Strengthening;Left;20 reps;Weights   seated shoulder 90/elbow 90 deg moving into ER   Theraband Level (Shoulder External Rotation) Level 1 (Yellow)      Shoulder Exercises: Sidelying   External Rotation Strengthening;Left;20 reps;Weights    Theraband Level (Shoulder External Rotation) Level 1 (Yellow)    External Rotation Weight (lbs) 2     ABduction Strengthening;Left;20 reps;Weights    ABduction Weight (lbs) 2      Shoulder Exercises: Standing   Other Standing Exercises plank on counter alternating shd lift with rotation 10 reps x 2 sets      Shoulder Exercises: ROM/Strengthening   Rebounder red ball x ~ 20 toss and catch    UBE (Upper Arm Bike) level 7 x 4 mins alt fwd/bkwd    Rhythmic Stabilization, Supine 1-2 min      Shoulder Exercises: Isometric Strengthening   External Rotation 5X10"      Shoulder Exercises: Stretch   Wall Stretch - Flexion 2 reps;30 seconds   hands over doorway   Other Shoulder Stretches three position doorway stretch 30 sec hold x 3 reps each position      Shoulder Exercises: Body Blade   Flexion 60 seconds;3 reps    ABduction 60 seconds;3 reps    Other Body Blade Exercises overhead w/ bilat UE's 30-60 sec x 3 reps                  PT Education - 01/31/21 1014    Education  Details HEP    Person(s) Educated Patient    Methods Explanation;Demonstration;Tactile cues;Verbal cues;Handout    Comprehension Verbalized understanding;Returned demonstration;Verbal cues required;Tactile cues required            PT Short Term Goals - 01/03/21 1337      PT SHORT TERM GOAL #1   Title Ind with initial HEP    Time 3    Period Weeks    Status New    Target Date 01/24/21             PT Long Term Goals - 01/03/21 1337      PT LONG TERM GOAL #1   Title Patient to report improved functional use of shoulder with ADLS by 91% or more.    Time 6    Period Weeks    Status New    Target Date 02/14/21      PT LONG TERM GOAL #2   Title Patient to demo improved left shoulder strength to 4+/5 and report 50% improvement with ADLS.    Time 6    Period Weeks    Status New      PT LONG TERM GOAL #3   Title Patient able to sleep 5 hours without waking due to shoulder pain.    Time 6    Period Weeks    Status New                 Plan - 01/31/21 7915    Clinical Impression  Statement Patient continues to work consistently on HEP and he is exercising in the gym as well. Patient has continued weakness in ER.    Rehab Potential Good    PT Frequency 2x / week    PT Duration 6 weeks    PT Treatment/Interventions ADLs/Self Care Home Management;Electrical Stimulation;Iontophoresis 4mg /ml Dexamethasone;Moist Heat;Therapeutic exercise;Therapeutic activities;Patient/family education;Neuromuscular re-education;Manual techniques;Dry needling;Taping    PT Next Visit Plan progress wt bearing exercises; strengthening and stabilization    PT Home Exercise Plan [email or web address]    Consulted and Agree with Plan of Care Patient           Patient will benefit from skilled therapeutic intervention in order to improve the following deficits and impairments:     Visit Diagnosis: Chronic left shoulder pain  Muscle weakness (generalized)  Stiffness of left shoulder, not elsewhere classified     Problem List Patient Active Problem List   Diagnosis Date Noted  . Rotator cuff tear, left 01/02/2021  . Iron deficiency anemia 12/31/2020  . Insomnia 12/31/2020  . Erectile dysfunction 12/31/2020  . Folliculitis 05/69/7948  . Essential hypertension 07/05/2020  . Blood loss anemia 07/05/2020  . Neoplasm of uncertain behavior of skin 07/05/2020  . Hyperlipidemia 07/05/2020  . Benign prostatic hyperplasia without lower urinary tract symptoms 07/05/2020  . Coronary artery disease involving native coronary artery of native heart without angina pectoris 07/05/2020  . Panniculitis 05/08/2020  . 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 Ventral hernia, recurrent 01/11/2019  . Basal cell carcinoma of abdomen 12/25/2015    Patton Swisher Nilda Simmer PT, MPH  01/31/2021, 10:15 AM  [facility] [place], [place], [address] Phone: [phone]   Fax:  [phone]  Name: Terry Acosta MRN: [id number] Date of Birth: [birth year]

## 2021-01-31 NOTE — Patient Instructions (Signed)
Access Code: [email or web address]: [email or web address]: 03/03/2022Prepared by: Gabby Rackers HoltExercises  Supine Shoulder External Rotation with Dumbbell - 1 x daily - 7 x weekly - 1-3 sets - 10 reps  Supine Shoulder Flexion with Free Weight - 1 x daily - 7 x weekly - 1-33 sets - 10 reps  Standing Single Arm Shoulder Abduction with Dumbbell - Thumb Up - 1 x daily - 7 x weekly - 1-3 sets - 10 reps  Standing Shoulder Internal Rotation with Anchored Resistance - 1 x daily - 3 x weekly - 1-3 sets - 12 reps  Prone Single Arm Shoulder Horizontal Abduction with Dumbbell - Palm Down - 1 x daily - 3-4 x weekly - 1-3 sets - 10 reps  Doorway Pec Stretch at 60 Degrees Abduction - 3 x daily - 7 x weekly - 3 reps - 1 sets  Doorway Pec Stretch at 90 Degrees Abduction - 3 x daily - 7 x weekly - 3 reps - 1 sets - 30 seconds hold  Doorway Pec Stretch at 120 Degrees Abduction - 3 x daily - 7 x weekly - 3 reps - 1 sets - 30 second hold hold  Standing Wall [employer] with [employer] - 2 x daily - 7 x weekly - 1 sets - 8-10 reps - 2-3 sec hold  Standing Wall Ball Circles in [employer] with Plyo Ball - 2 x daily - 7 x weekly - 1-2 sets - 8-10 reps - 2-3 sec hold  Plank with Shoulder Flexion on Counter - 2 x daily - 7 x weekly - 1 sets - 5-10 reps - 3-5 sec hold  Seated Shoulder External Rotation in Abduction Supported with Resistance - 2 x daily - 7 x weekly - 2 sets - 5-10 reps - 3-5 sec hold  Shoulder Flexion Serratus Activation with Resistance - 2 x daily - 7 x weekly - 2-3 sets - 10 reps - 3-5 sec hold  Standing High Shoulder Row with Anchored Resistance - 2 x daily - 7 x weekly - 2-3 sets - 10 reps - 3 sec hold  Squatting High Shoulder Row with Resistance - 2 x daily - 7 x weekly - 2-3 sets - 10 reps - 3 sec hold  Shoulder External Rotation Reactive Isometrics - 2 x daily - 7 x weekly - 1 sets - 10 reps - 3 sec hold  Shoulder Internal Rotation Reactive Isometrics - 2 x daily - 7 x weekly - 1 sets - 3 reps -  30 sec hold

## 2021-02-05 ENCOUNTER — Encounter: Payer: Self-pay | Admitting: Rehabilitative and Restorative Service Providers"

## 2021-02-05 ENCOUNTER — Ambulatory Visit (INDEPENDENT_AMBULATORY_CARE_PROVIDER_SITE_OTHER): Payer: Medicare Other | Admitting: Rehabilitative and Restorative Service Providers"

## 2021-02-05 ENCOUNTER — Other Ambulatory Visit: Payer: Self-pay

## 2021-02-05 DIAGNOSIS — G8929 Other chronic pain: Secondary | ICD-10-CM

## 2021-02-05 DIAGNOSIS — M6281 Muscle weakness (generalized): Secondary | ICD-10-CM

## 2021-02-05 DIAGNOSIS — M25512 Pain in left shoulder: Secondary | ICD-10-CM

## 2021-02-05 DIAGNOSIS — M25612 Stiffness of left shoulder, not elsewhere classified: Secondary | ICD-10-CM

## 2021-02-05 NOTE — Therapy (Addendum)
[place], [place], [address] Phone: [phone]   Fax:  [phone]  Physical Therapy Treatment  Progress Note Reporting Period 01/03/21 to 02/05/21  See note below for Objective Data and Assessment of Progress/Goals.       Patient Details  Name: Terry Acosta MRN: [id number] Date of Birth: 1951-01-19 Referring Provider (PT): Aundria Mems, MD   Encounter Date: 02/05/2021   PT End of Session - 02/05/21 0936    Visit Number 10    Number of Visits 12    Date for PT Re-Evaluation 02/14/21    Authorization Type MCR    Progress Note Due on Visit 10    PT Start Time 0930    PT Stop Time 1015    PT Time Calculation (min) 45 min    Activity Tolerance Patient tolerated treatment well           Past Medical History:  Diagnosis Date  . CAD (coronary artery disease)   . Colon polyps   . Diverticulitis   . GERD (gastroesophageal reflux disease)   . High cholesterol   . Hypertension     Past Surgical History:  Procedure Laterality Date  . ABDOMINAL PERINEAL BOWEL RESECTION     x 2  . HERNIA REPAIR     x 6  . KNEE ARTHROSCOPY Bilateral   . TONSILLECTOMY      There were no vitals filed for this visit.   Subjective Assessment - 02/05/21 0937    Subjective Patient reports that he was sore following the last PT visit. Better yesterday and today.    Currently in Pain? No/denies    Pain Score 0-No pain              [facility] PT Assessment - 02/05/21 0001      Assessment   Medical Diagnosis nontraumatic complete tear of Lt Rotator Cuff    Referring Provider (PT) Aundria Mems, MD    Onset Date/Surgical Date 12/02/15    Hand Dominance Right    Next MD Visit 6 weeks      AROM   AROM Assessment Site Shoulder    Right/Left Shoulder Left    Left Shoulder Extension 48 Degrees    Left Shoulder Flexion 170 Degrees    Left Shoulder ABduction 170 Degrees      Strength   Left  Shoulder Flexion --   5-/5 mild discomfort   Left Shoulder Extension 5/5    Left Shoulder ABduction --   5-/5 discomfort   Left Shoulder Internal Rotation --   5-/5 mild discomfort   Left Shoulder External Rotation 4+/5   mild discomfort     Palpation   Palpation comment tightness Lt periscapular musculature                         [facility] Adult PT Treatment/Exercise - 02/05/21 0001      Shoulder Exercises: Standing   Horizontal ABduction Strengthening;Left;Right;15 reps;Theraband   alternating in slight horizontal abduction into extension   Theraband Level (Shoulder Horizontal ABduction) Level 4 (Blue)    External Rotation Strengthening;Left;15 reps;Theraband   step out isometric   Theraband Level (Shoulder External Rotation) Level 4 (Blue)    Internal Rotation Strengthening;Left;15 reps;Theraband   isometric negative   ABduction Limitations UE flexion TB above wrists sliding hands up wall - and back down green TB; same position for horizontal abduction moving UE's to side stepping pause; step back  toward Acosta sides    Shoulder Elevation Limitations lowering eccentrically flexion and abduction - assisting with Rt UE to position Lt    Other Standing Exercises antirotaion blue TB 10 reps x 2 sets      Shoulder Exercises: ROM/Strengthening   UBE (Upper Arm Bike) level 7 x 4 mins alt fwd/bkwd      Shoulder Exercises: Body Blade   Flexion 60 seconds;3 reps    ABduction 60 seconds;3 reps    Other Body Blade Exercises overhead w/ bilat UE's 30-60 sec x 3 reps                  PT Education - 02/05/21 1005    Education Details HEP    Person(s) Educated Patient    Methods Explanation;Demonstration;Tactile cues;Verbal cues;Handout    Comprehension Verbalized understanding;Returned demonstration;Verbal cues required;Tactile cues required            PT Short Term Goals - 01/03/21 1337      PT SHORT TERM GOAL #1   Title Ind with initial HEP    Time 3    Period  Weeks    Status New    Target Date 01/24/21             PT Long Term Goals - 01/03/21 1337      PT LONG TERM GOAL #1   Title Patient to report improved functional use of shoulder with ADLS by 43% or more.    Time 6    Period Weeks    Status New    Target Date 02/14/21      PT LONG TERM GOAL #2   Title Patient to demo improved left shoulder strength to 4+/5 and report 50% improvement with ADLS.    Time 6    Period Weeks    Status New      PT LONG TERM GOAL #3   Title Patient able to sleep 5 hours without waking due to shoulder pain.    Time 6    Period Weeks    Status New                 Plan - 02/05/21 0959    Clinical Impression Statement Continued increase in Lt shoulder strength. Good gains in the past 1-2 weeks. Progressing well toward stated goals. Two additional visits scheduled.    Rehab Potential Good    PT Frequency 2x / week    PT Duration 6 weeks    PT Treatment/Interventions ADLs/Self Care Home Management;Electrical Stimulation;Iontophoresis 4mg /ml Dexamethasone;Moist Heat;Therapeutic exercise;Therapeutic activities;Patient/family education;Neuromuscular re-education;Manual techniques;Dry needling;Taping    PT Next Visit Plan progress wt bearing exercises; strengthening and stabilization    PT Home Exercise Plan [email or web address]    Consulted and Agree with Plan of Care Patient           Patient will benefit from skilled therapeutic intervention in order to improve the following deficits and impairments:     Visit Diagnosis: Chronic left shoulder pain  Muscle weakness (generalized)  Stiffness of left shoulder, not elsewhere classified     Problem List Patient Active Problem List   Diagnosis Date Noted  . Rotator cuff tear, left 01/02/2021  . Iron deficiency anemia 12/31/2020  . Insomnia 12/31/2020  . Erectile dysfunction 12/31/2020  . Folliculitis 15/40/0867  . Essential hypertension 07/05/2020  . Blood loss anemia 07/05/2020  . Neoplasm  of uncertain behavior of skin 07/05/2020  . Hyperlipidemia 07/05/2020  . Benign prostatic hyperplasia without lower urinary tract symptoms 07/05/2020  .  Coronary artery disease involving native coronary artery of native heart without angina pectoris 07/05/2020  . Panniculitis 05/08/2020  . 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 Ventral hernia, recurrent 01/11/2019  . Basal cell carcinoma of abdomen 12/25/2015    Jalee Saine Nilda Simmer  PT, MPH  02/05/2021, 10:16 AM  [facility] [place], [place], [address] Phone: [phone]   Fax:  [phone]  Name: Terry Acosta MRN: [id number] Date of Birth: 09-18-1951

## 2021-02-05 NOTE — Patient Instructions (Signed)
Access Code: [email or web address]: [email or web address]: 03/08/2022Prepared by: Naliah Eddington HoltExercises  Supine Shoulder External Rotation with Dumbbell - 1 x daily - 7 x weekly - 1-3 sets - 10 reps  Supine Shoulder Flexion with Free Weight - 1 x daily - 7 x weekly - 1-33 sets - 10 reps  Standing Single Arm Shoulder Abduction with Dumbbell - Thumb Up - 1 x daily - 7 x weekly - 1-3 sets - 10 reps  Standing Shoulder Internal Rotation with Anchored Resistance - 1 x daily - 3 x weekly - 1-3 sets - 12 reps  Prone Single Arm Shoulder Horizontal Abduction with Dumbbell - Palm Down - 1 x daily - 3-4 x weekly - 1-3 sets - 10 reps  Doorway Pec Stretch at 60 Degrees Abduction - 3 x daily - 7 x weekly - 3 reps - 1 sets  Doorway Pec Stretch at 90 Degrees Abduction - 3 x daily - 7 x weekly - 3 reps - 1 sets - 30 seconds hold  Doorway Pec Stretch at 120 Degrees Abduction - 3 x daily - 7 x weekly - 3 reps - 1 sets - 30 second hold hold  Standing Wall [employer] with [employer] - 2 x daily - 7 x weekly - 1 sets - 8-10 reps - 2-3 sec hold  Standing Wall Ball Circles in Scaption with Plyo Ball - 2 x daily - 7 x weekly - 1-2 sets - 8-10 reps - 2-3 sec hold  Plank with Shoulder Flexion on Counter - 2 x daily - 7 x weekly - 1 sets - 5-10 reps - 3-5 sec hold  Seated Shoulder External Rotation in Abduction Supported with Resistance - 2 x daily - 7 x weekly - 2 sets - 5-10 reps - 3-5 sec hold  Shoulder Flexion Serratus Activation with Resistance - 2 x daily - 7 x weekly - 2-3 sets - 10 reps - 3-5 sec hold  Standing High Shoulder Row with Anchored Resistance - 2 x daily - 7 x weekly - 2-3 sets - 10 reps - 3 sec hold  Squatting High Shoulder Row with Resistance - 2 x daily - 7 x weekly - 2-3 sets - 10 reps - 3 sec hold  Shoulder External Rotation Reactive Isometrics - 2 x daily - 7 x weekly - 1-2 sets - 10 reps - 3 sec hold  Shoulder Internal Rotation Reactive Isometrics - 2 x daily - 7 x weekly - 1-2 sets - 10  reps - 3 sec hold  Anti-Rotation Lateral Stepping with Press - 2 x daily - 7 x weekly - 1-2 sets - 10 reps - 2-3 sec hold

## 2021-02-07 ENCOUNTER — Ambulatory Visit (INDEPENDENT_AMBULATORY_CARE_PROVIDER_SITE_OTHER): Payer: Medicare Other | Admitting: Rehabilitative and Restorative Service Providers"

## 2021-02-07 ENCOUNTER — Other Ambulatory Visit: Payer: Self-pay

## 2021-02-07 ENCOUNTER — Encounter: Payer: Self-pay | Admitting: Rehabilitative and Restorative Service Providers"

## 2021-02-07 DIAGNOSIS — G8929 Other chronic pain: Secondary | ICD-10-CM | POA: Diagnosis not present

## 2021-02-07 DIAGNOSIS — M25512 Pain in left shoulder: Secondary | ICD-10-CM | POA: Diagnosis present

## 2021-02-07 DIAGNOSIS — M6281 Muscle weakness (generalized): Secondary | ICD-10-CM | POA: Diagnosis not present

## 2021-02-07 DIAGNOSIS — M25612 Stiffness of left shoulder, not elsewhere classified: Secondary | ICD-10-CM

## 2021-02-07 NOTE — Therapy (Addendum)
[place], [place], [address] Phone: [phone]   Fax:  [phone]  Physical Therapy Treatment  Patient Details  Name: Terry Acosta MRN: [id number] Date of Birth: 12-Oct-1951 Referring Provider (PT): Aundria Mems, MD   Encounter Date: 02/07/2021   PT End of Session - 02/07/21 0845    Visit Number 11    Number of Visits 12    Date for PT Re-Evaluation 02/14/21    Authorization Type MCR    Progress Note Due on Visit 20    PT Start Time 0843    PT Stop Time 0928    PT Time Calculation (min) 45 min    Activity Tolerance Patient tolerated treatment well           Past Medical History:  Diagnosis Date  . CAD (coronary artery disease)   . Colon polyps   . Diverticulitis   . GERD (gastroesophageal reflux disease)   . High cholesterol   . Hypertension     Past Surgical History:  Procedure Laterality Date  . ABDOMINAL PERINEAL BOWEL RESECTION     x 2  . HERNIA REPAIR     x 6  . KNEE ARTHROSCOPY Bilateral   . TONSILLECTOMY      There were no vitals filed for this visit.   Subjective Assessment - 02/07/21 0845    Subjective Doing OK - felt a "twinge" last night when getting up off the floor    Currently in Pain? No/denies    Pain Score 0-No pain              [facility] PT Assessment - 02/07/21 0001      Assessment   Medical Diagnosis nontraumatic complete tear of Lt Rotator Cuff    Referring Provider (PT) Aundria Mems, MD    Onset Date/Surgical Date 12/02/15    Hand Dominance Right    Next MD Visit 6 weeks      AROM   AROM Assessment Site Shoulder    Right/Left Shoulder Left    Left Shoulder Extension 48 Degrees    Left Shoulder Flexion 170 Degrees    Left Shoulder ABduction 170 Degrees      Strength   Left Shoulder Flexion --   5-/5 mild discomfort   Left Shoulder Extension 5/5    Left Shoulder ABduction --   5-/5 discomfort   Left Shoulder Internal  Rotation --   5-/5 mild discomfort   Left Shoulder External Rotation 4+/5   mild discomfort     Palpation   Palpation comment tightness Lt periscapular musculature                         [facility] Adult PT Treatment/Exercise - 02/07/21 0001      Self-Care   Other Self-Care Comments  observed pt getting in and out of the floor - he reports some pain in the Lt shoulder with this activity - no difficulty performing      Shoulder Exercises: Prone   Other Prone Exercises modified plank on rocker board with shoulder flexion x 20-25 reps    Other Prone Exercises hands and knees foam under knees - alternate shoulder flexion      Shoulder Exercises: Standing   Horizontal ABduction Strengthening;Left;Right;15 reps;Theraband   alternating in slight horizontal abduction into extension   Theraband Level (Shoulder Horizontal ABduction) Level 4 (Blue)    External Rotation Strengthening;Left;15 reps;Theraband   step out isometric  Theraband Level (Shoulder External Rotation) Level 4 (Blue)    Internal Rotation Strengthening;Left;15 reps;Theraband   isometric stepouts   Theraband Level (Shoulder Internal Rotation) Level 4 (Blue)    Flexion Strengthening;Left;10 reps   slow eccentric lowering   ABduction Left;20 reps   in scapular plane   Shoulder ABduction Weight (lbs) 3# first 10, then 2#    ABduction Limitations UE flexion TB above wrists sliding hands up wall - and back down green TB; same position for horizontal abduction moving UE's to side stepping pause; step back toward both sides    Shoulder Elevation Limitations lowering eccentrically flexion and abduction - assisting with Rt UE to position Lt    Other Standing Exercises antirotaion blue TB 10 reps x 2 sets      Shoulder Exercises: Therapy Ball   Other Therapy Ball Exercises ball on wall dorsum of hand - working on ER varying positions back to wall and turning to Rt - hand at waist then moving toward shoulder height    Other  Therapy Ball Exercises throw and catch ball x 30      Shoulder Exercises: ROM/Strengthening   UBE (Upper Arm Bike) level 7 x 4 mins alt fwd/bkwd      Shoulder Exercises: Body Blade   Flexion 60 seconds;3 reps    ABduction 60 seconds;3 reps    Other Body Blade Exercises overhead w/ bilat UE's 30-60 sec x 3 reps                    PT Short Term Goals - 01/03/21 1337      PT SHORT TERM GOAL #1   Title Ind with initial HEP    Time 3    Period Weeks    Status New    Target Date 01/24/21             PT Long Term Goals - 01/03/21 1337      PT LONG TERM GOAL #1   Title Patient to report improved functional use of shoulder with ADLS by 25% or more.    Time 6    Period Weeks    Status New    Target Date 02/14/21      PT LONG TERM GOAL #2   Title Patient to demo improved left shoulder strength to 4+/5 and report 50% improvement with ADLS.    Time 6    Period Weeks    Status New      PT LONG TERM GOAL #3   Title Patient able to sleep 5 hours without waking due to shoulder pain.    Time 6    Period Weeks    Status New                 Plan - 02/07/21 0846    Clinical Impression Statement Gradual gains in strength Lt shoulder. Working hard at home. Has made great progress with strength but continues to have decreased strength. He is pain free with the exception of an occasional "twinge". He has a variety of open and closed chain strengthening exercises to continue at home. Terry Acosta will continue with HEP and schedule appt with Dr Dianah Field.    Rehab Potential Good    PT Frequency 2x / week    PT Duration 6 weeks    PT Treatment/Interventions ADLs/Self Care Home Management;Electrical Stimulation;Iontophoresis 43m/ml Dexamethasone;Moist Heat;Therapeutic exercise;Therapeutic activities;Patient/family education;Neuromuscular re-education;Manual techniques;Dry needling;Taping    PT Next Visit Plan Patient will return to MD and notify [place]  if he wants to schedule  additional appointments    PT [place] and Agree with Plan of Care Patient           Patient will benefit from skilled therapeutic intervention in order to improve the following deficits and impairments:     Visit Diagnosis: Chronic left shoulder pain  Muscle weakness (generalized)  Stiffness of left shoulder, not elsewhere classified     Problem List Patient Active Problem List   Diagnosis Date Noted  . Rotator cuff tear, left 01/02/2021  . Iron deficiency anemia 12/31/2020  . Insomnia 12/31/2020  . Erectile dysfunction 12/31/2020  . Folliculitis 76/72/0947  . Essential hypertension 07/05/2020  . Blood loss anemia 07/05/2020  . Neoplasm of uncertain behavior of skin 07/05/2020  . Hyperlipidemia 07/05/2020  . Benign prostatic hyperplasia without lower urinary tract symptoms 07/05/2020  . Coronary artery disease involving native coronary artery of native heart without angina pectoris 07/05/2020  . Panniculitis 05/08/2020  . 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 Ventral hernia, recurrent 01/11/2019  . Basal cell carcinoma of abdomen 12/25/2015    Terry Acosta Terry Acosta PT, MPH  02/07/2021, 12:17 PM  [facility] Terry Acosta [place], [place], [address] Phone: [phone]   Fax:  [phone]  Name: Terry Acosta MRN: [id number] Date of Birth: 1951-05-03  PHYSICAL THERAPY DISCHARGE SUMMARY  Visits from Start of Care: 11  Current functional level related to goals / functional outcomes: See progress note discharge status; excellent progress with shoulder strengthening    Remaining deficits: Continued selective weakness Lt shoulder RC   Education / Equipment: HEP   Plan: Patient agrees to discharge.  Patient goals were met. Patient is being discharged due to meeting the stated rehab goals.  ?????     Terry Acosta PT, MPH 02/20/21 3:13 PM

## 2021-02-13 ENCOUNTER — Encounter: Payer: Medicare Other | Admitting: Rehabilitative and Restorative Service Providers"

## 2021-02-15 ENCOUNTER — Encounter: Payer: Medicare Other | Admitting: Physical Therapy

## 2021-02-20 ENCOUNTER — Telehealth: Payer: Self-pay | Admitting: General Practice

## 2021-02-20 NOTE — Telephone Encounter (Signed)
Transition Care Management Follow-up Telephone Call  Date of discharge and from where: [facility] on 02/18/21  How have you been since you were released from the hospital? Doing well.  Any questions or concerns? No  Items Reviewed:  Did the pt receive and understand the discharge instructions provided? Yes   Medications obtained and verified? Yes   Other? No   Any new allergies since your discharge? Yes   Dietary orders reviewed? Yes  Do you have support at home? Yes    Functional Questionnaire: (I = Independent and D = Dependent) ADLs: I  Bathing/Dressing- I  Meal Prep- I  Eating- I  Maintaining continence- I  Transferring/Ambulation- I  Managing Meds- I  Follow up appointments reviewed:   PCP Hospital f/u appt confirmed? Yes  Scheduled to see Dr. Zigmund Daniel on 02/26/21 @ 10.  [facility] f/u appt confirmed? Patient was asked to see Dr. Zigmund Daniel first and then make wound care referral if needed.  Are transportation arrangements needed? Yes   If their condition worsens, is the pt aware to call PCP or go to the Emergency Dept.? Yes  Was the patient provided with contact information for the PCP's office or ED? Yes  Was to pt encouraged to call back with questions or concerns? Yes

## 2021-02-26 ENCOUNTER — Ambulatory Visit (INDEPENDENT_AMBULATORY_CARE_PROVIDER_SITE_OTHER): Payer: Medicare Other | Admitting: Family Medicine

## 2021-02-26 ENCOUNTER — Other Ambulatory Visit: Payer: Self-pay

## 2021-02-26 ENCOUNTER — Encounter: Payer: Self-pay | Admitting: Family Medicine

## 2021-02-26 VITALS — BP 159/84 | HR 59 | Temp 97.7°F | Ht 74.0 in | Wt 283.8 lb

## 2021-02-26 DIAGNOSIS — S61219A Laceration without foreign body of unspecified finger without damage to nail, initial encounter: Secondary | ICD-10-CM | POA: Insufficient documentation

## 2021-02-26 DIAGNOSIS — T8130XA Disruption of wound, unspecified, initial encounter: Secondary | ICD-10-CM

## 2021-02-26 DIAGNOSIS — S61212A Laceration without foreign body of right middle finger without damage to nail, initial encounter: Secondary | ICD-10-CM | POA: Diagnosis not present

## 2021-02-26 MED ORDER — CEPHALEXIN 500 MG PO CAPS: 500.0000 mg | ORAL_CAPSULE | Freq: Three times a day (TID) | ORAL | 0 refills | Status: AC

## 2021-02-26 NOTE — Assessment & Plan Note (Signed)
Sutures removed from ring finger without difficulty.  This appears to be healing well.  The middle finger has dehiscence of wound with maceration of skin and some surrounding purulence.  I think keeping this uncovered would be helpful.  I did redress for him until he got home today.  I am also going to restart Keflex and I have placed referral to wound care.  Remaining 2 sutures left until follow-up later this week.

## 2021-02-26 NOTE — Patient Instructions (Signed)
Return on Friday for wound recheck.

## 2021-02-26 NOTE — Progress Notes (Signed)
Terry Acosta - 70 y.o. male MRN [id number]  Date of birth: [birth year]  Subjective Chief Complaint  Patient presents with  . Suture / Staple Removal    HPI Terry Acosta is a 70 y.o. male here today for recent ER visit follow-up.  He had laceration to pads of fingers after cutting this on a lawnmower blade.  He was seen in the ED and had laceration repair.  He was started on cephalexin for infection prevention.  He presents today to have sutures removed.  He still has pain as well as some drainage around the middle finger of the R hand..  He denies fever or chills.  He has kept finger covered up until now.   ROS:  A comprehensive ROS was completed and negative except as noted per HPI  Allergies  Allergen Reactions  . Penicillins Other (See Comments)    Syncope  . Shellfish Allergy     Past Medical History:  Diagnosis Date  . CAD (coronary artery disease)   . Colon polyps   . Diverticulitis   . GERD (gastroesophageal reflux disease)   . High cholesterol   . Hypertension     Past Surgical History:  Procedure Laterality Date  . ABDOMINAL PERINEAL BOWEL RESECTION     x 2  . HERNIA REPAIR     x 6  . KNEE ARTHROSCOPY Bilateral   . TONSILLECTOMY      Social History   Socioeconomic History  . Marital status: Married    Spouse name: Not on file  . Number of children: 2  . Years of education: Not on file  . Highest education level: Not on file  Occupational History  . Occupation: Self Employed  Tobacco Use  . Smoking status: Never Smoker  . Smokeless tobacco: Never Used  Vaping Use  . Vaping Use: Never used  Substance and Sexual Activity  . Alcohol use: Yes    Alcohol/week: 5.0 standard drinks    Types: 5 Standard drinks or equivalent per week  . Drug use: Never  . Sexual activity: Yes    Partners: Female  Other Topics Concern  . Not on file  Social History Narrative  . Not on file   Social Determinants of Health   Financial Resource Strain: Not on  file  Food Insecurity: Not on file  Transportation Needs: Not on file  Physical Activity: Not on file  Stress: Not on file  Social Connections: Not on file    Family History  Problem Relation Age of Onset  . Hypertension Father   . Prostate cancer Father     Health Maintenance  Topic Date Due  . Hepatitis C Screening  Never done  . COLONOSCOPY (Pts 45-50yrs Insurance coverage will need to be confirmed)  Never done  . COVID-19 Vaccine (4 - Booster for Moderna series) 03/31/2021  . TETANUS/TDAP  12/16/2023  . INFLUENZA VACCINE  Completed  . PNA vac Low Risk Adult  Completed  . HPV VACCINES  Aged Out     ----------------------------------------------------------------------------------------------------------------------------------------------------------------------------------------------------------------- Physical Exam BP (!) 159/84 (BP Location: Left Arm, Patient Position: Sitting, Cuff Size: Large)   Pulse (!) 59   Temp 97.7 F (36.5 C)   Ht 6\' 2"  (1.88 m)   Wt 283 lb 12.8 oz (128.7 kg)   SpO2 98%   BMI 36.44 kg/m   Physical Exam Constitutional:      Appearance: Normal appearance.  HENT:     Head: Normocephalic and atraumatic.  Eyes:  General: No scleral icterus. Musculoskeletal:     Cervical back: Neck supple.  Skin:    Comments: Laceration to pad of ring finger, healing well.  Sutures removed.   Laceration to middle finger.  There is desquamation of the pad of the ring finger with maceration of the tissues.  Dehiscence noted at area of tissue repair with sutures.  1 of 3 sutures removed.  There is swelling of the pad of the finger with some surrounding purulent drainage.  Neurological:     Mental Status: He is alert.  Psychiatric:        Mood and Affect: Mood normal.        Behavior: Behavior normal.      ------------------------------------------------------------------------------------------------------------------------------------------------------------------------------------------------------------------- Assessment and Plan  Laceration of finger Sutures removed from ring finger without difficulty.  This appears to be healing well.  The middle finger has dehiscence of wound with maceration of skin and some surrounding purulence.  I think keeping this uncovered would be helpful.  I did redress for him until he got home today.  I am also going to restart Keflex and I have placed referral to wound care.  Remaining 2 sutures left until follow-up later this week.   Meds ordered this encounter  Medications  . cephALEXin (KEFLEX) 500 MG capsule    Sig: Take 1 capsule (500 mg total) by mouth 3 (three) times daily for 10 days.    Dispense:  30 capsule    Refill:  0    Return in about 3 days (around 03/01/2021) for Wound recheck.    This visit occurred during the SARS-CoV-2 public health emergency.  Safety protocols were in place, including screening questions prior to the visit, additional usage of staff PPE, and extensive cleaning of exam room while observing appropriate contact time as indicated for disinfecting solutions.

## 2021-02-28 ENCOUNTER — Telehealth: Payer: Self-pay | Admitting: Family Medicine

## 2021-02-28 NOTE — Telephone Encounter (Signed)
Cindy-Please check to see if [facility] in [place] may be able to get him in sooner.  Thanks!

## 2021-02-28 NOTE — Telephone Encounter (Signed)
Patient called today and canceled appointment for tomorrow and stated he removed his own stitches. Made an appointment with wound care in [place] on 4/12. Wanted to know if we could call and get him into [facility] sooner. Please advise.

## 2021-03-01 ENCOUNTER — Ambulatory Visit: Payer: Medicare Other | Admitting: Family Medicine

## 2021-03-04 NOTE — Telephone Encounter (Signed)
Cindy, let's keep appt for 4/12.  Panya, please let him know that nothing earlier is available in [place].

## 2021-03-04 NOTE — Telephone Encounter (Signed)
Dr Zigmund Daniel   I called [facility] and their first available is not until May 3rd.  I called [facility] and left a message just to see what the earliest they had would be.  I called Terry Acosta to let him know and he stated he would keep his April 12th appointment unless [facility] had something this week.  He stated the wound seems to be healing real well at this time. - CF

## 2021-03-04 NOTE — Telephone Encounter (Deleted)
Dr. Zigmund Daniel   I spoke with [facility] and their first available day is not until May 3rd would you like me to [place] or somewhere else or just leave appointment for 4/12?  Jenny Reichmann

## 2021-03-18 ENCOUNTER — Encounter: Payer: Self-pay | Admitting: Family Medicine

## 2021-03-19 MED ORDER — ZOLPIDEM TARTRATE 5 MG PO TABS: 5.0000 mg | ORAL_TABLET | Freq: Every day | ORAL | 1 refills | Status: DC

## 2021-03-21 ENCOUNTER — Other Ambulatory Visit: Payer: Self-pay | Admitting: Family Medicine

## 2021-03-21 MED ORDER — ROSUVASTATIN CALCIUM 40 MG PO TABS: 40.0000 mg | ORAL_TABLET | Freq: Every day | ORAL | 0 refills | Status: DC

## 2021-03-21 MED ORDER — ATENOLOL 50 MG PO TABS: 50.0000 mg | ORAL_TABLET | Freq: Every day | ORAL | 0 refills | Status: DC

## 2021-03-21 MED ORDER — LISINOPRIL 5 MG PO TABS: 5.0000 mg | ORAL_TABLET | Freq: Every day | ORAL | 0 refills | Status: DC

## 2021-04-10 ENCOUNTER — Ambulatory Visit (INDEPENDENT_AMBULATORY_CARE_PROVIDER_SITE_OTHER): Payer: Medicare Other | Admitting: Family Medicine

## 2021-04-10 ENCOUNTER — Other Ambulatory Visit: Payer: Self-pay

## 2021-04-10 ENCOUNTER — Encounter: Payer: Self-pay | Admitting: Family Medicine

## 2021-04-10 DIAGNOSIS — G4733 Obstructive sleep apnea (adult) (pediatric): Secondary | ICD-10-CM | POA: Diagnosis not present

## 2021-04-10 DIAGNOSIS — Z9989 Dependence on other enabling machines and devices: Secondary | ICD-10-CM | POA: Diagnosis not present

## 2021-04-10 NOTE — Progress Notes (Signed)
Terry Acosta - 70 y.o. male MRN [id number]  Date of birth: [birth year]  Subjective Chief Complaint  Patient presents with  . Fatigue    HPI Terry Acosta is a 70 y.o. male here today for follow up of OSA.  He is treated with CPAP and recently received new machine due to recall of his [employer].  He needs updated compliance data.  Currently using [employer] for supplies. He is using CPAP nightly and tolerating well.  He has improvement in sleep with CPAP use.  Compliance data reviewed:  97% compliance (>4 hours/night) over the past 30 days, AHI of 1.75 events per hour.    ROS:  A comprehensive ROS was completed and negative except as noted per HPI  Allergies  Allergen Reactions  . Penicillins Other (See Comments)    Syncope  . Shellfish Allergy     Past Medical History:  Diagnosis Date  . CAD (coronary artery disease)   . Colon polyps   . Diverticulitis   . GERD (gastroesophageal reflux disease)   . High cholesterol   . Hypertension     Past Surgical History:  Procedure Laterality Date  . ABDOMINAL PERINEAL BOWEL RESECTION     x 2  . HERNIA REPAIR     x 6  . KNEE ARTHROSCOPY Bilateral   . TONSILLECTOMY      Social History   Socioeconomic History  . Marital status: Married    Spouse name: Not on file  . Number of children: 2  . Years of education: Not on file  . Highest education level: Not on file  Occupational History  . Occupation: Self Employed  Tobacco Use  . Smoking status: Never Smoker  . Smokeless tobacco: Never Used  Vaping Use  . Vaping Use: Never used  Substance and Sexual Activity  . Alcohol use: Yes    Alcohol/week: 5.0 standard drinks    Types: 5 Standard drinks or equivalent per week  . Drug use: Never  . Sexual activity: Yes    Partners: Female  Other Topics Concern  . Not on file  Social History Narrative  . Not on file   Social Determinants of Health   Financial Resource Strain: Not on file  Food Insecurity: Not on file   Transportation Needs: Not on file  Physical Activity: Not on file  Stress: Not on file  Social Connections: Not on file    Family History  Problem Relation Age of Onset  . Hypertension Father   . Prostate cancer Father     Health Maintenance  Topic Date Due  . Hepatitis C Screening  Never done  . COLONOSCOPY (Pts 45-67yrs Insurance coverage will need to be confirmed)  Never done  . COVID-19 Vaccine (4 - Booster for Moderna series) 03/31/2021  . INFLUENZA VACCINE  07/01/2021  . TETANUS/TDAP  12/16/2023  . PNA vac Low Risk Adult  Completed  . HPV VACCINES  Aged Out     ----------------------------------------------------------------------------------------------------------------------------------------------------------------------------------------------------------------- Physical Exam BP (!) 159/92 (BP Location: Left Arm, Patient Position: Sitting, Cuff Size: Large)   Pulse 65   Ht 6\' 2"  (1.88 m)   Wt 273 lb 12.8 oz (124.2 kg)   SpO2 96%   BMI 35.15 kg/m   Physical Exam Constitutional:      Appearance: Normal appearance.  Eyes:     General: No scleral icterus. Cardiovascular:     Rate and Rhythm: Normal rate and regular rhythm.  Pulmonary:     Effort: Pulmonary effort is normal.  Breath sounds: Normal breath sounds.  Musculoskeletal:     Cervical back: Neck supple.  Neurological:     General: No focal deficit present.     Mental Status: He is alert.  Psychiatric:        Mood and Affect: Mood normal.        Behavior: Behavior normal.     ------------------------------------------------------------------------------------------------------------------------------------------------------------------------------------------------------------------- Assessment and Plan  OSA on CPAP Doing well with CPAP.  Compliant with use.  Data reviewed and compliance reports sent for scanning     No orders of the defined types were placed in this encounter.   No  follow-ups on file.    This visit occurred during the SARS-CoV-2 public health emergency.  Safety protocols were in place, including screening questions prior to the visit, additional usage of staff PPE, and extensive cleaning of exam room while observing appropriate contact time as indicated for disinfecting solutions.

## 2021-04-10 NOTE — Assessment & Plan Note (Signed)
Doing well with CPAP.  Compliant with use.  Data reviewed and compliance reports sent for scanning

## 2021-04-10 NOTE — Patient Instructions (Addendum)
I will get information sent over to CPAP supply company.  Let me know if you need anything additional

## 2021-04-11 NOTE — Progress Notes (Signed)
Faxed office visit notes to [facility] @ [phone]

## 2021-04-15 ENCOUNTER — Telehealth: Payer: Self-pay

## 2021-04-15 NOTE — Telephone Encounter (Signed)
Pt stopped here to leave some paperwork for Dr. Rodman Key to fill out for a C-Pap. He just stated to give this to you and you would know what to do with it. - Paperwork left in message box- tvt

## 2021-04-18 NOTE — Telephone Encounter (Signed)
We faxed office note with compliance information over, correct?

## 2021-04-19 NOTE — Telephone Encounter (Signed)
Correct

## 2021-05-29 ENCOUNTER — Ambulatory Visit (INDEPENDENT_AMBULATORY_CARE_PROVIDER_SITE_OTHER): Payer: Medicare Other | Admitting: Family Medicine

## 2021-05-29 VITALS — Ht 74.5 in | Wt 272.0 lb

## 2021-05-29 DIAGNOSIS — Z0000 Encounter for general adult medical examination without abnormal findings: Secondary | ICD-10-CM

## 2021-05-29 NOTE — Progress Notes (Signed)
MEDICARE ANNUAL WELLNESS VISIT  05/29/2021  Telephone Visit Disclaimer This Medicare AWV was conducted by telephone due to national recommendations for restrictions regarding the COVID-19 Pandemic (e.g. social distancing).  I verified, using two identifiers, that I am speaking with Terry Acosta or their authorized healthcare agent. I discussed the limitations, risks, security, and privacy concerns of performing an evaluation and management service by telephone and the potential availability of an in-person appointment in the future. The patient expressed understanding and agreed to proceed.  Location of Patient: Home Location of Provider (nurse):  In the office.  Subjective:    Terry Acosta is a 70 y.o. male patient of Luetta Nutting, DO who had a Medicare Annual Wellness Visit today via telephone. Terry Acosta is Retired and lives with their spouse. he has 2 children. he reports that he is socially active and does interact with friends/family regularly. he is moderately physically active and enjoys music, photography and volunteering.  Patient Care Team: Luetta Nutting, DO as PCP - General (Family Medicine)  Advanced Directives 05/29/2021 01/03/2021 07/05/2020  Does Patient Have a Medical Advance Directive? Yes Yes Yes  Type of Paramedic of [place];Living will [place];Living will [place];Living will;Out of facility DNR (pink MOST or yellow form)  Does patient want to make changes to medical advance directive? No - Patient declined No - Patient declined No - Patient declined  Copy of [place] in Chart? No - copy requested No - copy requested [facility] Utilization Over the Past 12 Months: # of hospitalizations or ER visits: 1 # of surgeries: 0  Review of Systems    Patient reports that his overall health is unchanged compared to last year.  History obtained from chart review and the  patient  Patient Reported Readings (BP, Pulse, CBG, Weight, etc) Weight 272 lbs Height 54f2.5in  Pain Assessment Pain : No/denies pain     Current Medications & Allergies (verified) Allergies as of 05/29/2021       Reactions   Penicillins Other (See Comments)   Syncope   Shellfish Allergy         Medication List        Accurate as of May 29, 2021  9:29 AM. If you have any questions, ask your nurse or doctor.          AMBULATORY NON FORMULARY MEDICATION Please provide autopap machine with humidifier.  Pressure 6-16 cmH20.  Please provide supplies including mask per patient choice, filters, heated and humidified tubing.  Diagnosis: OSA G47.33   aspirin EC 81 MG tablet Take 1 tablet (81 mg total) by mouth daily. Swallow whole.   atenolol 50 MG tablet Commonly known as: TENORMIN Take 1 tablet (50 mg total) by mouth daily.   esomeprazole 20 MG packet Commonly known as: NEXIUM   furosemide 40 MG tablet Commonly known as: LASIX Take 40 mg by mouth. As needed.   HYDROXYZINE PAMOATE PO   lisinopril 5 MG tablet Commonly known as: ZESTRIL Take 1 tablet (5 mg total) by mouth daily.   multivitamin capsule Take 1 capsule by mouth daily.   Respiratory Therapy Supplies Devi by Does not apply route.   rosuvastatin 40 MG tablet Commonly known as: CRESTOR Take 1 tablet (40 mg total) by mouth daily.   tadalafil 5 MG tablet Commonly known as: CIALIS Take 1-2 tablets (5-10 mg total) by mouth daily as needed for erectile dysfunction. Using good Rx card  zolpidem 5 MG tablet Commonly known as: AMBIEN Take 1 tablet (5 mg total) by mouth at bedtime.        History (reviewed): Past Medical History:  Diagnosis Date   CAD (coronary artery disease)    Colon polyps    Diverticulitis    GERD (gastroesophageal reflux disease)    High cholesterol    Hypertension    Past Surgical History:  Procedure Laterality Date   ABDOMINAL PERINEAL BOWEL RESECTION     x 2    HERNIA REPAIR     x 6   KNEE ARTHROSCOPY Bilateral    TONSILLECTOMY     Family History  Problem Relation Age of Onset   Hypertension Father    Prostate cancer Father    Social History   Socioeconomic History   Marital status: Married    Spouse name: Bassam Dresch   Number of children: 2   Years of education: 16   Highest education level: Bachelor's degree (e.g., BA, AB, BS)  Occupational History   Occupation: Self Employed   Occupation: Retired  Tobacco Use   Smoking status: Never   Smokeless tobacco: Never  Vaping Use   Vaping Use: Never used  Substance and Sexual Activity   Alcohol use: Yes    Alcohol/week: 5.0 standard drinks    Types: 5 Standard drinks or equivalent per week   Drug use: Never   Sexual activity: Yes    Partners: Female  Other Topics Concern   Not on file  Social History Narrative   Lives with his wife. He enjoys photography, music and volunteering.   Social Determinants of Health   Financial Resource Strain: Low Risk    Difficulty of Paying Living Expenses: Not hard at all  Food Insecurity: No Food Insecurity   Worried About Charity fundraiser in the Last Year: Never true   [place] in the Last Year: Never true  Transportation Needs: No Transportation Needs   Lack of Transportation (Medical): No   Lack of Transportation (Non-Medical): No  Physical Activity: Insufficiently Active   Days of Exercise per Week: 2 days   Minutes of Exercise per Session: 60 min  Stress: No Stress Concern Present   Feeling of Stress : Not at all  Social Connections: Socially Integrated   Frequency of Communication with Friends and Family: Once a week   Frequency of Social Gatherings with Friends and Family: Three times a week   Attends Religious Services: More than 4 times per year   Active Member of Clubs or Organizations: Yes   Attends Archivist Meetings: More than 4 times per year   Marital Status: Married    Activities of Daily  Living In your present state of health, do you have any difficulty performing the following activities: 05/29/2021  Hearing? Y  Comment hearing aids bilaterally  Vision? N  Difficulty concentrating or making decisions? N  Walking or climbing stairs? Y  Comment climbing stairs (he gets tired easily)  Dressing or bathing? N  Doing errands, shopping? N  Preparing Food and eating ? N  Using the Toilet? N  In the past six months, have you accidently leaked urine? N  Do you have problems with loss of bowel control? N  Managing your Medications? N  Managing your Finances? N  Housekeeping or managing your Housekeeping? N  Some recent data might be hidden    Patient Education/ Literacy How often do you need to have someone help you when  you read instructions, pamphlets, or other written materials from your doctor or pharmacy?: 1 - Never What is the last grade level you completed in school?: Bachelor's degree  Exercise Current Exercise Habits: Home exercise routine, Type of exercise: walking;strength training/weights, Time (Minutes): > 60, Frequency (Times/Week): >7, Weekly Exercise (Minutes/Week): 0, Intensity: Moderate, Exercise limited by: None identified  Diet Patient reports consuming  2-3  meals a day and 0 snack(s) a day Patient reports that his primary diet is: Regular Patient reports that she does have regular access to food.   Depression Screen PHQ 2/9 Scores 05/29/2021 07/05/2020  PHQ - 2 Score 0 1  PHQ- 9 Score - 4     Fall Risk Fall Risk  05/29/2021 02/26/2021 07/05/2020  Falls in the past year? 0 0 0  Number falls in past yr: 0 0 0  Injury with Fall? 0 0 0  Risk for fall due to : No Fall Risks No Fall Risks No Fall Risks  Follow up Falls evaluation completed Falls evaluation completed -     Objective:  Terry Acosta seemed alert and oriented and he participated appropriately during our telephone visit.  Blood Pressure Weight BMI  BP Readings from Last 3 Encounters:   04/10/21 (!) 159/92  02/26/21 (!) 159/84  12/31/20 (!) 143/74   Wt Readings from Last 3 Encounters:  05/29/21 272 lb (123.4 kg)  04/10/21 273 lb 12.8 oz (124.2 kg)  02/26/21 283 lb 12.8 oz (128.7 kg)   BMI Readings from Last 1 Encounters:  05/29/21 34.46 kg/m    *Unable to obtain current vital signs, weight, and BMI due to telephone visit type  Hearing/Vision  Terry Acosta did not seem to have difficulty with hearing/understanding during the telephone conversation Reports that he has had a formal eye exam by an eye care professional within the past year Reports that he has not had a formal hearing evaluation within the past year *Unable to fully assess hearing and vision during telephone visit type  Cognitive Function: 6CIT Screen 05/29/2021  What Year? 0 points  What month? 0 points  What time? 0 points  Count back from 20 0 points  Months in reverse 0 points  Repeat phrase 0 points  Total Score 0   (Normal:0-7, Significant for Dysfunction: >8)  Normal Cognitive Function Screening: Yes   Immunization & Health Maintenance Record Immunization History  Administered Date(s) Administered   Influenza Split 09/30/2012, 09/08/2016   Influenza Whole 08/31/2017, 09/01/2019   Influenza, High Dose Seasonal PF 09/15/2014, 09/13/2015, 09/22/2017, 09/02/2018   Influenza, Seasonal, Injecte, Preservative Fre 12/03/2013   Influenza-Unspecified 08/01/2020   Moderna Sars-Covid-2 Vaccination 02/08/2020, 03/07/2020, 10/01/2020   Pneumococcal Conjugate-13 12/14/2014   Pneumococcal-Unspecified 08/31/2017   Td 12/15/2013   Zoster Recombinat (Shingrix) 09/02/2018, 11/01/2018, 01/26/2019   Zoster, Live 12/14/2014    Health Maintenance  Topic Date Due   COVID-19 Vaccine (4 - Booster for Moderna series) 06/14/2021 (Originally 01/01/2021)   COLONOSCOPY (Pts 45-4yrs Insurance coverage will need to be confirmed)  05/29/2022 (Originally 02/06/1994)   Hepatitis C Screening  05/29/2022 (Originally  02/07/1969)   INFLUENZA VACCINE  07/01/2021   TETANUS/TDAP  12/16/2023   PNA vac Low Risk Adult  Completed   Zoster Vaccines- Shingrix  Completed   HPV VACCINES  Aged Out       Assessment  This is a routine wellness examination for Terry Acosta.  Health Maintenance: Due or Overdue There are no preventive care reminders to display for this patient.   Terry Acosta does  not need a referral for Community Assistance: Care Management:   no Social Work:    no Prescription Assistance:  no Nutrition/Diabetes Education:  no   Plan:  Personalized Goals  Goals Addressed               This Visit's Progress     Patient Stated (pt-stated)        05/29/2021 AWV Goal: Exercise for General Health  Patient will verbalize understanding of the benefits of increased physical activity: Exercising regularly is important. It will improve your overall fitness, flexibility, and endurance. Regular exercise also will improve your overall health. It can help you control your weight, reduce stress, and improve your bone density. Over the next year, patient will increase physical activity as tolerated with a goal of at least 150 minutes of moderate physical activity per week.  You can tell that you are exercising at a moderate intensity if your heart starts beating faster and you start breathing faster but can still hold a conversation. Moderate-intensity exercise ideas include: Walking 1 mile (1.6 km) in about 15 minutes Biking Hiking Golfing Dancing Water aerobics Patient will verbalize understanding of everyday activities that increase physical activity by providing examples like the following: Yard work, such as: Sales promotion account executive Gardening Washing windows or floors Patient will be able to explain general safety guidelines for exercising:  Before you start a new exercise program, talk with your health  care provider. Do not exercise so much that you hurt yourself, feel dizzy, or get very short of breath. Wear comfortable clothes and wear shoes with good support. Drink plenty of water while you exercise to prevent dehydration or heat stroke. Work out until your breathing and your heartbeat get faster.         Personalized Health Maintenance & Screening Recommendations  Colorectal cancer screening- Patient stated that he had one five years ago in [place] and he will have the report faxed to the office.  Lung Cancer Screening Recommended: no (Low Dose CT Chest recommended if Age 34-80 years, 30 pack-year currently smoking OR have quit w/in past 15 years) Hepatitis C Screening recommended: yes HIV Screening recommended: no  Advanced Directives: Written information was not prepared per patient's request.  Referrals & Orders No orders of the defined types were placed in this encounter.   Follow-up Plan Follow-up with Luetta Nutting, DO as planned Medicare wellness visit in one year. Patient will access AVS on my chart.   I have personally reviewed and noted the following in the patient's chart:   Medical and social history Use of alcohol, tobacco or illicit drugs  Current medications and supplements Functional ability and status Nutritional status Physical activity Advanced directives List of other physicians Hospitalizations, surgeries, and ER visits in previous 12 months Vitals Screenings to include cognitive, depression, and falls Referrals and appointments  In addition, I have reviewed and discussed with Terry Acosta certain preventive protocols, quality metrics, and best practice recommendations. A written personalized care plan for preventive services as well as general preventive health recommendations is available and can be mailed to the patient at his request.      Terry Gens, RN  05/29/2021

## 2021-05-29 NOTE — Patient Instructions (Addendum)
[place] Maintenance Summary and Written Plan of Care  [name] ,  Thank you for allowing me to perform your Medicare Annual Wellness Visit and for your ongoing commitment to your health.   Health Maintenance & Immunization History Health Maintenance  Topic Date Due   COVID-19 Vaccine (4 - Booster for Moderna series) 06/14/2021 (Originally 01/01/2021)   COLONOSCOPY (Pts 45-20yrs Insurance coverage will need to be confirmed)  05/29/2022 (Originally 02/06/1994)   Hepatitis C Screening  05/29/2022 (Originally [birth year]/1967)   INFLUENZA VACCINE  07/01/2021   TETANUS/TDAP  12/16/2023   PNA vac Low Risk Adult  Completed   Zoster Vaccines- Shingrix  Completed   HPV VACCINES  Aged Out   Immunization History  Administered Date(s) Administered   Influenza Split 09/30/2012, 09/08/2016   Influenza Whole 08/31/2017, 09/01/2019   Influenza, High Dose Seasonal PF 09/15/2014, 09/13/2015, 09/22/2017, 09/02/2018   Influenza, Seasonal, Injecte, Preservative Fre 12/03/2013   Influenza-Unspecified 08/01/2020   Moderna Sars-Covid-2 Vaccination 02/08/2020, 03/07/2020, 10/01/2020   Pneumococcal Conjugate-13 12/14/2014   Pneumococcal-Unspecified 08/31/2017   Td 12/15/2013   Zoster Recombinat (Shingrix) 09/02/2018, 11/01/2018, 01/26/2019   Zoster, Live 12/14/2014    These are the patient goals that we discussed:  Goals Addressed               This Visit's Progress     Patient Stated (pt-stated)        05/29/2021 AWV Goal: Exercise for General Health  Patient will verbalize understanding of the benefits of increased physical activity: Exercising regularly is important. It will improve your overall fitness, flexibility, and endurance. Regular exercise also will improve your overall health. It can help you control your weight, reduce stress, and improve your bone density. Over the next year, patient will increase physical activity as tolerated with a goal of at least  150 minutes of moderate physical activity per week.  You can tell that you are exercising at a moderate intensity if your heart starts beating faster and you start breathing faster but can still hold a conversation. Moderate-intensity exercise ideas include: Walking 1 mile (1.6 km) in about 15 minutes Biking Hiking Golfing Dancing Water aerobics Patient will verbalize understanding of everyday activities that increase physical activity by providing examples like the following: Yard work, such as: Sales promotion account executive Gardening Washing windows or floors Patient will be able to explain general safety guidelines for exercising:  Before you start a new exercise program, talk with your health care provider. Do not exercise so much that you hurt yourself, feel dizzy, or get very short of breath. Wear comfortable clothes and wear shoes with good support. Drink plenty of water while you exercise to prevent dehydration or heat stroke. Work out until your breathing and your heartbeat get faster.           This is a list of Health Maintenance Items that are overdue or due now: Colonoscopy- Patient will get the report faxed over.  Orders/Referrals Placed Today: No orders of the defined types were placed in this encounter.  (Contact our referral department at [phone] if you have not spoken with someone about your referral appointment within the next 5 days)    Follow-up Plan Follow-up with Terry Nutting, DO as planned Medicare wellness visit in one year. Patient will access AVS on my chart.   Health Maintenance, Male Adopting a healthy lifestyle and getting preventive care are important in promoting  health and wellness. Ask your health care provider about: The right schedule for you to have regular tests and exams. Things you can do on your own to prevent diseases and keep yourself healthy. What  should I know about diet, weight, and exercise? Eat a healthy diet  Eat a diet that includes plenty of vegetables, fruits, low-fat dairy products, and lean protein. Do not eat a lot of foods that are high in solid fats, added sugars, or sodium.  Maintain a healthy weight Body mass index (BMI) is a measurement that can be used to identify possible weight problems. It estimates body fat based on height and weight. Your health care provider can help determine your BMI and help you achieve or maintain ahealthy weight. Get regular exercise Get regular exercise. This is one of the most important things you can do for your health. Most adults should: Exercise for at least 150 minutes each week. The exercise should increase your heart rate and make you sweat (moderate-intensity exercise). Do strengthening exercises at least twice a week. This is in addition to the moderate-intensity exercise. Spend less time sitting. Even light physical activity can be beneficial. Watch cholesterol and blood lipids Have your blood tested for lipids and cholesterol at 72 years of age, then havethis test every 5 years. You may need to have your cholesterol levels checked more often if: Your lipid or cholesterol levels are high. You are older than 72 years of age. You are at high risk for heart disease. What should I know about cancer screening? Many types of cancers can be detected early and may often be prevented. Depending on your health history and family history, you may need to have cancer screening at various ages. This may include screening for: Colorectal cancer. Prostate cancer. Skin cancer. Lung cancer. What should I know about heart disease, diabetes, and high blood pressure? Blood pressure and heart disease High blood pressure causes heart disease and increases the risk of stroke. This is more likely to develop in people who have high blood pressure readings, are of African descent, or are  overweight. Talk with your health care provider about your target blood pressure readings. Have your blood pressure checked: Every 3-5 years if you are 27-49 years of age. Every year if you are 46 years old or older. If you are between the ages of 52 and 78 and are a current or former smoker, ask your health care provider if you should have a one-time screening for abdominal aortic aneurysm (AAA). Diabetes Have regular diabetes screenings. This checks your fasting blood sugar level. Have the screening done: Once every three years after age 63 if you are at a normal weight and have a low risk for diabetes. More often and at a younger age if you are overweight or have a high risk for diabetes. What should I know about preventing infection? Hepatitis B If you have a higher risk for hepatitis B, you should be screened for this virus. Talk with your health care provider to find out if you are at risk forhepatitis B infection. Hepatitis C Blood testing is recommended for: Everyone born from [birth year] through 1967. Anyone with known risk factors for hepatitis C. Sexually transmitted infections (STIs) You should be screened each year for STIs, including gonorrhea and chlamydia, if: You are sexually active and are younger than 72 years of age. You are older than 72 years of age and your health care provider tells you that you are at risk for this  type of infection. Your sexual activity has changed since you were last screened, and you are at increased risk for chlamydia or gonorrhea. Ask your health care provider if you are at risk. Ask your health care provider about whether you are at high risk for HIV. Your health care provider may recommend a prescription medicine to help prevent HIV infection. If you choose to take medicine to prevent HIV, you should first get tested for HIV. You should then be tested every 3 months for as long as you are taking the medicine. Follow these instructions at  home: Lifestyle Do not use any products that contain nicotine or tobacco, such as cigarettes, e-cigarettes, and chewing tobacco. If you need help quitting, ask your health care provider. Do not use street drugs. Do not share needles. Ask your health care provider for help if you need support or information about quitting drugs. Alcohol use Do not drink alcohol if your health care provider tells you not to drink. If you drink alcohol: Limit how much you have to 0-2 drinks a day. Be aware of how much alcohol is in your drink. In the [place] one drink equals one 12 oz bottle of beer (355 mL), one 5 oz glass of wine (148 mL), or one 1 oz glass of hard liquor (44 mL). General instructions Schedule regular health, dental, and eye exams. Stay current with your vaccines. Tell your health care provider if: You often feel depressed. You have ever been abused or do not feel safe at home. Summary Adopting a healthy lifestyle and getting preventive care are important in promoting health and wellness. Follow your health care provider's instructions about healthy diet, exercising, and getting tested or screened for diseases. Follow your health care provider's instructions on monitoring your cholesterol and blood pressure. This information is not intended to replace advice given to you by your health care provider. Make sure you discuss any questions you have with your healthcare provider. Document Revised: 11/10/2018 Document Reviewed: 11/10/2018 Elsevier Patient Education  2022 [employer].

## 2021-05-31 ENCOUNTER — Other Ambulatory Visit: Payer: Self-pay | Admitting: Family Medicine

## 2021-05-31 DIAGNOSIS — R42 Dizziness and giddiness: Secondary | ICD-10-CM

## 2021-06-05 ENCOUNTER — Ambulatory Visit (INDEPENDENT_AMBULATORY_CARE_PROVIDER_SITE_OTHER): Payer: Medicare Other | Admitting: Rehabilitative and Restorative Service Providers"

## 2021-06-05 ENCOUNTER — Other Ambulatory Visit: Payer: Self-pay

## 2021-06-05 DIAGNOSIS — R42 Dizziness and giddiness: Secondary | ICD-10-CM

## 2021-06-05 DIAGNOSIS — R2681 Unsteadiness on feet: Secondary | ICD-10-CM | POA: Diagnosis not present

## 2021-06-05 DIAGNOSIS — R2689 Other abnormalities of gait and mobility: Secondary | ICD-10-CM | POA: Diagnosis not present

## 2021-06-05 NOTE — Therapy (Signed)
[facility] [place], [place], [address] Phone: [phone]   Fax:  [phone]  Physical Therapy Evaluation  Patient Details  Name: Terry Acosta MRN: [id number] Date of Birth: [birth year] Referring Provider (PT): Luetta Nutting, DO   Encounter Date: 06/05/2021   PT End of Session - 06/05/21 1126     Visit Number 1    Number of Visits 6    Date for PT Re-Evaluation 07/17/21    Authorization Type medicare A and B    Progress Note Due on Visit 10    PT Start Time 1019    PT Stop Time 1106    PT Time Calculation (min) 47 min             Past Medical History:  Diagnosis Date   CAD (coronary artery disease)    Colon polyps    Diverticulitis    GERD (gastroesophageal reflux disease)    High cholesterol    Hypertension     Past Surgical History:  Procedure Laterality Date   ABDOMINAL PERINEAL BOWEL RESECTION     x 2   HERNIA REPAIR     x 6   KNEE ARTHROSCOPY Bilateral    TONSILLECTOMY      There were no vitals filed for this visit.    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  He reports "I like to be pushed."  He notes imbalance walking on unlevel surfaces.    Patient Stated Goals "I want better balance."    Currently in Pain? Yes    Pain Location Generalized    Pain Radiating Towards ankles, back and left shoulder intermittent chronic pain                [facility] PT Assessment - 06/05/21 1019       Assessment   Medical Diagnosis vertigo    Referring Provider (PT) Luetta Nutting, DO    Onset Date/Surgical Date 05/31/21    Hand Dominance Right      Balance Screen   Has the patient fallen in the past 6 months No    Has the patient had a decrease in activity level because of a fear of falling?  No    Is the patient reluctant to leave their home because of a fear of falling?  No      Home  [place] residence    Living Arrangements Spouse/significant other    Type of [place]      Observation/Other Assessments   Focus on Therapeutic Outcomes (FOTO)  53%      High Level Balance   High Level Balance Comments multi-sensory balance training on foam with head motion and foam with eyes closed; patient has 6/10 dizziness with head nods and head rotation onfoam x 5 reps.  He can stand 5 seconds with eyes closed before requiring assist for balance.  PT provided HEP for feet narrow with eyes closed (solid surface).                    Vestibular Assessment - 06/05/21 1032       Vestibular Assessment   General Observation Patient walks into the clinic without device.      Symptom Behavior   Subjective history of current problem The patient reports a h/o sudden onset of vertigo that lasted for months 35-40 years  ago    Type of Dizziness  Imbalance   a sensation of head "filled"   Frequency of Dizziness daily    Duration of Dizziness constant    Symptom Nature Constant    Aggravating Factors Turning body quickly;Turning head quickly   walking on grass   Relieving Factors Head stationary;Rest    Progression of Symptoms Worse    History of similar episodes 40 years ago had a ruptured ear drum with replacement (tympanoplasty)-- unsure if vertigo began then, but not sure of timeline      Oculomotor Exam   Oculomotor Alignment Abnormal   L eye hypertropia, 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 with eyes not being level   Ocular ROM WNLs    Spontaneous Absent    Gaze-induced  Absent    Smooth Pursuits Intact    Saccades Undershoots   one correction to get to midline from R   Comment wears progressive lenses      Vestibulo-Ocular Reflex   VOR 1 Head Only (x 1 viewing) 6/10 dizziness after 10 reps    VOR Cancellation Normal    Comment head is in a state of "fullness"; head impulse test= positive to the  right side      Visual Acuity   Static level 7   5 line difference in SVA versus DVA   Dynamic level 2   (>2 line difference is abnormal indicating dec'd VOR and vestibular hypofunction)     Positional Testing   Dix-Hallpike Dix-Hallpike Right;Dix-Hallpike Left    Horizontal Canal Testing Horizontal Canal Right;Horizontal Canal Left      Dix-Hallpike Right   Dix-Hallpike Right Duration none    Dix-Hallpike Right Symptoms No nystagmus      Dix-Hallpike Left   Dix-Hallpike Left Duration none    Dix-Hallpike Left Symptoms No nystagmus      Horizontal Canal Right   Horizontal Canal Right Duration none    Horizontal Canal Right Symptoms Normal      Horizontal Canal Left   Horizontal Canal Left Duration none    Horizontal Canal Left Symptoms Normal      Positional Sensitivities   Positional Sensitivities Comments notes dizziness rising after positional testing-- more motion sensitivity than true positional symptoms                Objective measurements completed on examination: See above findings.        Vestibular Treatment/Exercise - 06/05/21 1125       Vestibular Treatment/Exercise   Vestibular Treatment Provided Gaze    Gaze Exercises X1 Viewing Horizontal;X1 Viewing Vertical      X1 Viewing Horizontal   Foot Position standing; moved to seated for HEP due to sway    Comments 30 seconds with cues on visual fixation      X1 Viewing Vertical   Foot Position sitting    Comments 30 seconds                   PT Education - 06/05/21 1125     Education Details HEP    Person(s) Educated Patient    Methods Explanation;Demonstration;Handout    Comprehension Verbalized understanding;Returned demonstration                 PT Long Term Goals - 06/05/21 1126       PT LONG TERM GOAL #1   Title The patient will be indep with HEP for gaze adaptation, motion sensitivity and multi-sensory  balance.    Time 6    Period Weeks    Target Date 07/17/21       PT LONG TERM GOAL #2   Title The patient will improve SVA versus DVA from 5 line difference to < or equal to 3 line difference demonstrating improved use of vestibular ocular reflex.    Time 6    Period Weeks    Target Date 07/17/21      PT LONG TERM GOAL #3   Title The patient will maintain standing on compliant foam surface x 30 seconds with eyes closed without use of external surfaces.    Time 6    Period Weeks    Target Date 07/17/21      PT LONG TERM GOAL #4   Title The patient will improve functional status score from 53% up to 61% demonstrating improved self perception of balance.    Time 6    Period Weeks    Target Date 07/17/21                    Plan - 06/05/21 1130     Clinical Impression Statement The patient is a 70 yo male known to our clinic from prior PT.  He presents today with referral for vertigo.  The patient has impairments in vestibular ocular reflex (per + head impulse testing and 5 line difference on static versus dynamic visual acuity), multi-sensory balance, and motion sensitivity.  This leads to functional limitations in quick head movements, balance on outdoor/unlevel surfaces, and visual sensitivityin visually stimulating environments.  PT to address deficits to work to [employer].    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 Stable/Uncomplicated    Designer, jewellery Low    Rehab Potential Good    PT Frequency 1x / week    PT Duration 6 weeks    PT Treatment/Interventions ADLs/Self Care Home Management;Vestibular;Patient/family education;Functional mobility training;Therapeutic activities;Therapeutic  exercise;Balance training;Neuromuscular re-education;Gait training;Stair training    PT Next Visit Plan progress gaze adaptation exercises working up to 1 minute x 3 sets x 3 times/day and then move to standing position; standing habituation with head motion; test FGA; progress balance    PT Home Exercise Plan Access Code: [email or web address]    Consulted and Agree with Plan of Care Patient             Patient will benefit from skilled therapeutic intervention in order to improve the following deficits and impairments:  Decreased balance, Dizziness, Impaired vision/preception, Difficulty walking, Decreased activity tolerance  Visit Diagnosis: Dizziness and giddiness  Other abnormalities of gait and mobility  Unsteadiness on feet     Problem List Patient Active Problem List   Diagnosis Date Noted   OSA on CPAP 04/10/2021   Laceration of finger 02/26/2021   Rotator cuff tear, left 01/02/2021   Insomnia 12/31/2020   Erectile dysfunction 12/31/2020   Essential hypertension 07/05/2020   Neoplasm of uncertain behavior of skin 07/05/2020   Hyperlipidemia 07/05/2020   Benign prostatic hyperplasia without lower urinary tract symptoms 07/05/2020   Coronary artery disease involving native coronary artery of native heart without angina pectoris 07/05/2020   Panniculitis 05/08/2020   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

## 2021-06-05 NOTE — Patient Instructions (Signed)
Access Code: [email or web address] URL: [email or web address] Date: 06/05/2021 Prepared by: Rudell Cobb  Exercises Romberg Stance with Eyes Closed - 2 x daily - 7 x weekly - 1 sets - 3 reps - 30 seconds hold Seated Gaze Stabilization with Head Rotation - 2 x daily - 7 x weekly - 3 sets - 1 reps - 30 seconds hold Seated Gaze Stabilization with Head Nod - 2 x daily - 7 x weekly - 3 sets - 1 reps - 30 seconds hold

## 2021-06-11 ENCOUNTER — Ambulatory Visit (INDEPENDENT_AMBULATORY_CARE_PROVIDER_SITE_OTHER): Payer: Medicare Other | Admitting: Rehabilitative and Restorative Service Providers"

## 2021-06-11 ENCOUNTER — Other Ambulatory Visit: Payer: Self-pay

## 2021-06-11 DIAGNOSIS — R2681 Unsteadiness on feet: Secondary | ICD-10-CM | POA: Diagnosis not present

## 2021-06-11 DIAGNOSIS — R42 Dizziness and giddiness: Secondary | ICD-10-CM

## 2021-06-11 DIAGNOSIS — R2689 Other abnormalities of gait and mobility: Secondary | ICD-10-CM | POA: Diagnosis not present

## 2021-06-11 NOTE — Therapy (Signed)
[place], [place], [address] Phone: [phone]   Fax:  [phone]  Physical Therapy Treatment  Patient Details  Name: Terry Acosta MRN: [id number] Date of Birth: 1951/03/30 Referring Provider (PT): Luetta Nutting, DO   Encounter Date: 06/11/2021   PT End of Session - 06/11/21 1606     Visit Number 2    Number of Visits 6    Date for PT Re-Evaluation 07/17/21    Authorization Type medicare A and B    Progress Note Due on Visit 10    PT Start Time 1104    PT Stop Time 1145    PT Time Calculation (min) 41 min             Past Medical History:  Diagnosis Date   CAD (coronary artery disease)    Colon polyps    Diverticulitis    GERD (gastroesophageal reflux disease)    High cholesterol    Hypertension     Past Surgical History:  Procedure Laterality Date   ABDOMINAL PERINEAL BOWEL RESECTION     x 2   HERNIA REPAIR     x 6   KNEE ARTHROSCOPY Bilateral    TONSILLECTOMY      There were no vitals filed for this visit.   Subjective Assessment - 06/11/21 1107     Subjective The patient notes exercises feel easy for him.    Patient Stated Goals "I want better balance."    Currently in Pain? Yes    Pain Location Generalized    Pain Frequency Intermittent    Aggravating Factors  varies day to day    Pain Relieving Factors unsure                [facility] PT Assessment - 06/11/21 1129       Assessment   Medical Diagnosis vertigo    Referring Provider (PT) Luetta Nutting, DO    Onset Date/Surgical Date 05/31/21    Hand Dominance Right      Functional Gait  Assessment   Gait assessed  Yes    Gait Level Surface Walks 20 ft in less than 7 sec but greater than 5.5 sec, uses assistive device, slower speed, mild gait deviations, or deviates 6-10 in outside of the 12 in walkway width.   5.8 seconds   Change in Gait Speed Able to smoothly change walking speed without loss of  balance or gait deviation. Deviate no more than 6 in outside of the 12 in walkway width.    Gait with Horizontal Head Turns Performs head turns smoothly with slight change in gait velocity (eg, minor disruption to smooth gait path), deviates 6-10 in outside 12 in walkway width, or uses an assistive device.    Gait with Vertical Head Turns Performs task with slight change in gait velocity (eg, minor disruption to smooth gait path), deviates 6 - 10 in outside 12 in walkway width or uses assistive device    Gait and Pivot Turn Pivot turns safely within 3 sec and stops quickly with no loss of balance.    Step Over Obstacle Is able to step over 2 stacked shoe boxes taped together (9 in total height) without changing gait speed. No evidence of imbalance.    Gait with Narrow Base of Support Ambulates less than 4 steps heel to toe or cannot perform without assistance.    Gait with Eyes Closed Walks 20 ft, slow speed, abnormal gait pattern, evidence for  imbalance, deviates 10-15 in outside 12 in walkway width. Requires more than 9 sec to ambulate 20 ft.    Ambulating Backwards Walks 20 ft, uses assistive device, slower speed, mild gait deviations, deviates 6-10 in outside 12 in walkway width.    Steps Alternating feet, must use rail.    Total Score 20    FGA comment: 20/30                           [facility] Adult PT Treatment/Exercise - 06/11/21 1607       Ambulation/Gait   Pre-Gait Activities heel walking and toe walkig x 20 feet with SBA      Neuro Re-ed    Neuro Re-ed Details  Standing corner balance on static surface with eyes closed, partial heel toe adding head turns, and then foam with wide stance + EC and then narrow stance with EO      Exercises   Exercises Other Exercises    Other Exercises  single leg heel raises x 8 R and 10L (fatigues quickly).             Vestibular Treatment/Exercise - 06/11/21 1609       Vestibular Treatment/Exercise   Vestibular Treatment  Provided Gaze;Habituation    Habituation Exercises Standing Horizontal Head Turns;Standing Vertical Head Turns    Gaze Exercises X1 Viewing Horizontal;X1 Viewing Vertical      Standing Horizontal Head Turns   Number of Reps  10    Symptom Description  partial heel/toe stance      Standing Vertical Head Turns   Number of Reps  10    Symptom Description  partial heel/toe stance      X1 Viewing Horizontal   Foot Position standing; cues for balance    Comments 30 seconds      X1 Viewing Vertical   Foot Position standing    Comments 30 seconds                   PT Education - 06/11/21 1605     Education Details progressed HEP    Person(s) Educated Patient    Methods Explanation;Demonstration;Handout    Comprehension Verbalized understanding;Returned demonstration                 PT Long Term Goals - 06/05/21 1126       PT LONG TERM GOAL #1   Title The patient will be indep with HEP for gaze adaptation, motion sensitivity and multi-sensory balance.    Time 6    Period Weeks    Target Date 07/17/21      PT LONG TERM GOAL #2   Title The patient will improve SVA versus DVA from 5 line difference to < or equal to 3 line difference demonstrating improved use of vestibular ocular reflex.    Time 6    Period Weeks    Target Date 07/17/21      PT LONG TERM GOAL #3   Title The patient will maintain standing on compliant foam surface x 30 seconds with eyes closed without use of external surfaces.    Time 6    Period Weeks    Target Date 07/17/21      PT LONG TERM GOAL #4   Title The patient will improve functional status score from 53% up to 61% demonstrating improved self perception of balance.    Time 6    Period Weeks    Target Date  07/17/21                   Plan - 06/11/21 1606     Clinical Impression Statement The patient tolerated progression of gaze adaptation to standing and the addition of balance HEP.  PT to progress to LTGs.    PT  Treatment/Interventions ADLs/Self Care Home Management;Vestibular;Patient/family education;Functional mobility training;Therapeutic activities;Therapeutic exercise;Balance training;Neuromuscular re-education;Gait training;Stair training    PT Next Visit Plan progress gaze adaptation exercises working up to 1 minute x 3 sets x 3 times/day; standing habituation with head motion; progress balance    PT Home Exercise Plan Access Code: [email or web address]    Consulted and Agree with Plan of Care Patient             Patient will benefit from skilled therapeutic intervention in order to improve the following deficits and impairments:     Visit Diagnosis: Dizziness and giddiness  Other abnormalities of gait and mobility  Unsteadiness on feet     Problem List Patient Active Problem List   Diagnosis Date Noted   OSA on CPAP 04/10/2021   Laceration of finger 02/26/2021   Rotator cuff tear, left 01/02/2021   Insomnia 12/31/2020   Erectile dysfunction 12/31/2020   Essential hypertension 07/05/2020   Neoplasm of uncertain behavior of skin 07/05/2020   Hyperlipidemia 07/05/2020   Benign prostatic hyperplasia without lower urinary tract symptoms 07/05/2020   Coronary artery disease involving native coronary artery of native heart without angina pectoris 07/05/2020   Panniculitis 05/08/2020   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

## 2021-06-11 NOTE — Patient Instructions (Signed)
Access Code: [email or web address] URL: [email or web address] Date: 06/11/2021 Prepared by: Rudell Cobb  Exercises Standing Gaze Stabilization with Head Rotation - 2 x daily - 7 x weekly - 3 sets - 1 reps - 30 seconds hold Standing Gaze Stabilization with Head Nod - 2 x daily - 7 x weekly - 3 sets - 1 reps - 30 seconds hold Romberg Stance with Eyes Closed - 2 x daily - 7 x weekly - 1 sets - 3 reps - 30 seconds hold Half Tandem Stance Balance with Head Rotation - 2 x daily - 7 x weekly - 1 sets - 10 reps Half Tandem Stance Balance with Head Nods - 2 x daily - 7 x weekly - 1 sets - 10 reps Single Leg Stance - 2 x daily - 7 x weekly - 1 sets - 2 reps - 10-15 seconds hold Standing Heel Raise with Support - 2 x daily - 7 x weekly - 1 sets - 10-15 reps

## 2021-06-17 ENCOUNTER — Encounter: Payer: Self-pay | Admitting: Family Medicine

## 2021-06-18 ENCOUNTER — Other Ambulatory Visit: Payer: Self-pay | Admitting: Family Medicine

## 2021-06-18 MED ORDER — LISINOPRIL 5 MG PO TABS: 5.0000 mg | ORAL_TABLET | Freq: Every day | ORAL | 2 refills | Status: DC

## 2021-06-19 ENCOUNTER — Ambulatory Visit (INDEPENDENT_AMBULATORY_CARE_PROVIDER_SITE_OTHER): Payer: Medicare Other | Admitting: Rehabilitative and Restorative Service Providers"

## 2021-06-19 ENCOUNTER — Encounter: Payer: Self-pay | Admitting: Rehabilitative and Restorative Service Providers"

## 2021-06-19 ENCOUNTER — Other Ambulatory Visit: Payer: Self-pay

## 2021-06-19 DIAGNOSIS — R2689 Other abnormalities of gait and mobility: Secondary | ICD-10-CM | POA: Diagnosis not present

## 2021-06-19 DIAGNOSIS — R42 Dizziness and giddiness: Secondary | ICD-10-CM

## 2021-06-19 DIAGNOSIS — R2681 Unsteadiness on feet: Secondary | ICD-10-CM | POA: Diagnosis not present

## 2021-06-19 NOTE — Therapy (Signed)
[place], [place], [address] Phone: [phone]   Fax:  [phone]  Physical Therapy Treatment  Patient Details  Name: Terry Acosta MRN: [id number] Date of Birth: 08/03/1951 Referring Provider (PT): Luetta Nutting, DO   Encounter Date: 06/19/2021   PT End of Session - 06/19/21 1103     Visit Number 3    Number of Visits 6    Date for PT Re-Evaluation 07/17/21    Authorization Type medicare A and B    Progress Note Due on Visit 10    PT Start Time 1100    PT Stop Time 1145    PT Time Calculation (min) 45 min    Activity Tolerance Patient tolerated treatment well    Behavior During Therapy [facility] for tasks assessed/performed             Past Medical History:  Diagnosis Date   CAD (coronary artery disease)    Colon polyps    Diverticulitis    GERD (gastroesophageal reflux disease)    High cholesterol    Hypertension     Past Surgical History:  Procedure Laterality Date   ABDOMINAL PERINEAL BOWEL RESECTION     x 2   HERNIA REPAIR     x 6   KNEE ARTHROSCOPY Bilateral    TONSILLECTOMY      There were no vitals filed for this visit.   Subjective Assessment - 06/19/21 1101     Subjective The patient reports hehad dizzinesson Friday-Saturday that was more consistent in nature.  It has returned to baseline.    Patient Stated Goals "I want better balance."    Currently in Pain? No/denies                [facility] PT Assessment - 06/19/21 1103       Assessment   Medical Diagnosis vertigo    Referring Provider (PT) Luetta Nutting, DO    Onset Date/Surgical Date 05/31/21    Hand Dominance Right                 Vestibular Assessment - 06/19/21 1103       Vestibular Assessment   General Observation Symptoms worsened Friday-Saturday and have returned back to baseline.      Positional Testing   Dix-Hallpike Dix-Hallpike Right;Dix-Hallpike Left    Horizontal Canal  Testing Horizontal Canal Right;Horizontal Canal Left      Dix-Hallpike Right   Dix-Hallpike Right Duration none    Dix-Hallpike Right Symptoms No nystagmus      Dix-Hallpike Left   Dix-Hallpike Left Duration none    Dix-Hallpike Left Symptoms No nystagmus      Horizontal Canal Right   Horizontal Canal Right Duration none    Horizontal Canal Right Symptoms Normal      Horizontal Canal Left   Horizontal Canal Left Duration none    Horizontal Canal Left Symptoms Normal                      [facility] Adult PT Treatment/Exercise - 06/19/21 1103       Ambulation/Gait   Pre-Gait Activities Heel and toe walking      Neuro Re-ed    Neuro Re-ed Details  Multi-sensory balance training on foam and rocker board performing head turns and eyes closed; Also performed reactive balance with lateral and ant/posterior board direction.  sit<>stand with eyes closed x 6 reps  *PT provided CGA to min A for dynamic standing  tasks.      Exercises   Exercises Ankle      Ankle Exercises: Stretches   Gastroc Stretch 1 rep;30 seconds      Ankle Exercises: Standing   Other Standing Ankle Exercises single leg stance      Ankle Exercises: Seated   Heel Raises 20 reps    Other Seated Ankle Exercises red theraband ankle eversion and ankle inversion x 10 reps             Vestibular Treatment/Exercise - 06/19/21 1237       Vestibular Treatment/Exercise   Vestibular Treatment Provided Gaze;Habituation    Habituation Exercises Standing Horizontal Head Turns;Standing Vertical Head Turns;180 degree Turns;360 degree Turns    Gaze Exercises X1 Viewing Horizontal      Standing Horizontal Head Turns   Number of Reps  10    Symptom Description  on compliant foam as well as on a rocker board; used visual cues to work on compensatory strategies to reduce imbalance      Standing Vertical Head Turns   Number of Reps  10    Symptom Description  on compliant foam as well as on a rocker board; used  visual cues to work on compensatory strategies to reduce imbalance      180 degree Turns   Number of Reps  5    Symptom Description  initial performed quarter turns      360 degree Turns   Number of Reps  3    COMMENT used visual cues for spotting to reduc eimbalance      X1 Viewing Horizontal   Foot Position standing wide and narrow    Comments 60 seconds                        PT Long Term Goals - 06/05/21 1126       PT LONG TERM GOAL #1   Title The patient will be indep with HEP for gaze adaptation, motion sensitivity and multi-sensory balance.    Time 6    Period Weeks    Target Date 07/17/21      PT LONG TERM GOAL #2   Title The patient will improve SVA versus DVA from 5 line difference to < or equal to 3 line difference demonstrating improved use of vestibular ocular reflex.    Time 6    Period Weeks    Target Date 07/17/21      PT LONG TERM GOAL #3   Title The patient will maintain standing on compliant foam surface x 30 seconds with eyes closed without use of external surfaces.    Time 6    Period Weeks    Target Date 07/17/21      PT LONG TERM GOAL #4   Title The patient will improve functional status score from 53% up to 61% demonstrating improved self perception of balance.    Time 6    Period Weeks    Target Date 07/17/21                   Plan - 06/19/21 1259     Clinical Impression Statement The patient has ankle weakness contributing to dec'd sensation of balance.  PT continuing to progress gaze adaptation, balance, ankle strengthening tasks to improve functional mobility.    PT Treatment/Interventions ADLs/Self Care Home Management;Vestibular;Patient/family education;Functional mobility training;Therapeutic activities;Therapeutic exercise;Balance training;Neuromuscular re-education;Gait training;Stair training    PT Next Visit Plan progress to patient tolerance;  outdoor/unlevel surface training    PT Home Exercise Plan Access  Code: [email or web address]    [email or web address] and Agree with Plan of Care Patient             Patient will benefit from skilled therapeutic intervention in order to improve the following deficits and impairments:     Visit Diagnosis: Dizziness and giddiness  Other abnormalities of gait and mobility  Unsteadiness on feet     Problem List Patient Active Problem List   Diagnosis Date Noted   OSA on CPAP 04/10/2021   Laceration of finger 02/26/2021   Rotator cuff tear, left 01/02/2021   Insomnia 12/31/2020   Erectile dysfunction 12/31/2020   Essential hypertension 07/05/2020   Neoplasm of uncertain behavior of skin 07/05/2020   Hyperlipidemia 07/05/2020   Benign prostatic hyperplasia without lower urinary tract symptoms 07/05/2020   Coronary artery disease involving native coronary artery of native heart without angina pectoris 07/05/2020   Panniculitis 05/08/2020   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

## 2021-06-20 ENCOUNTER — Encounter: Payer: Self-pay | Admitting: Family Medicine

## 2021-06-21 ENCOUNTER — Other Ambulatory Visit: Payer: Self-pay | Admitting: Family Medicine

## 2021-06-21 MED ORDER — DOXYCYCLINE HYCLATE 100 MG PO TABS: 100.0000 mg | ORAL_TABLET | Freq: Two times a day (BID) | ORAL | 0 refills | Status: DC

## 2021-06-25 ENCOUNTER — Other Ambulatory Visit: Payer: Self-pay

## 2021-06-25 ENCOUNTER — Ambulatory Visit (INDEPENDENT_AMBULATORY_CARE_PROVIDER_SITE_OTHER): Payer: Medicare Other | Admitting: Rehabilitative and Restorative Service Providers"

## 2021-06-25 DIAGNOSIS — R2689 Other abnormalities of gait and mobility: Secondary | ICD-10-CM | POA: Diagnosis not present

## 2021-06-25 DIAGNOSIS — R42 Dizziness and giddiness: Secondary | ICD-10-CM | POA: Diagnosis present

## 2021-06-25 DIAGNOSIS — R2681 Unsteadiness on feet: Secondary | ICD-10-CM | POA: Diagnosis not present

## 2021-06-25 NOTE — Therapy (Signed)
[place] Ac[place], [place], [address] Phone: [phone]   Fax:  [phone]  Physical Therapy Treatment  Patient Details  Name: Terry Acosta MRN: [id number] Date of Birth: 1951-07-25 Referring Provider (PT): Luetta Nutting, DO   Encounter Date: 06/25/2021   PT End of Session - 06/25/21 0907     Visit Number 4    Number of Visits 6    Date for PT Re-Evaluation 07/17/21    Authorization Type medicare A and B    Progress Note Due on Visit 10    PT Start Time 0847    PT Stop Time 0927    PT Time Calculation (min) 40 min    Activity Tolerance Patient tolerated treatment well    Behavior During Therapy [facility] for tasks assessed/performed             Past Medical History:  Diagnosis Date   CAD (coronary artery disease)    Colon polyps    Diverticulitis    GERD (gastroesophageal reflux disease)    High cholesterol    Hypertension     Past Surgical History:  Procedure Laterality Date   ABDOMINAL PERINEAL BOWEL RESECTION     x 2   HERNIA REPAIR     x 6   KNEE ARTHROSCOPY Bilateral    TONSILLECTOMY      There were no vitals filed for this visit.   Subjective Assessment - 06/25/21 0849     Subjective The letter exercise is getting easier.  Outdoor walking is always an issue per patient.    Patient Stated Goals "I want better balance."                [facility] PT Assessment - 06/25/21 1503       Assessment   Medical Diagnosis vertigo    Referring Provider (PT) Luetta Nutting, DO    Onset Date/Surgical Date 05/31/21                           [facility] Adult PT Treatment/Exercise - 06/25/21 1503       Ambulation/Gait   Ambulation/Gait Yes    Ambulation/Gait Assistance 7: Independent    Ambulation Distance (Feet) 800 Feet    Assistive device None    Gait Comments Community gait using visual fixation to improve balance when walking in grass, descending hills,  negotiating curbs and unlevel surfaces.  PT provided SBA.      Neuro Re-ed    Neuro Re-ed Details  Compliant surface negotiation outdoors walking through gravel and grass (see gait).      Foam standing with step ups and single leg activities tapping to cones laterally.  Also performed foam standing + eyes closed with intermittent CGA.  Performed standing wall bumps on foam surface working on hip strategy.      Exercises   Exercises Ankle      Ankle Exercises: Standing   Heel Raises Both;20 reps             Vestibular Treatment/Exercise - 06/25/21 1504       Vestibular Treatment/Exercise   Vestibular Treatment Provided Habituation;Gaze    Habituation Exercises 180 degree Turns    Gaze Exercises X1 Viewing Horizontal      180 degree Turns   Number of Reps  5    Symptom Description  using visual cues      X1 Viewing Horizontal   Foot Position x 1 minute,  20 seconds encouraging longer periods of time during HEP                   PT Education - 06/25/21 1500     Education Details modified current HEP    Person(s) Educated Patient    Methods Explanation;Demonstration;Handout    Comprehension Verbalized understanding;Returned demonstration                 PT Long Term Goals - 06/25/21 0908       PT LONG TERM GOAL #1   Title The patient will be indep with HEP for gaze adaptation, motion sensitivity and multi-sensory balance.    Time 6    Period Weeks    Status Achieved      PT LONG TERM GOAL #2   Title The patient will improve SVA versus DVA from 5 line difference to < or equal to 3 line difference demonstrating improved use of vestibular ocular reflex.    Baseline 4 line difference on 06/25/21    Time 6    Period Weeks    Status Partially Met      PT LONG TERM GOAL #3   Title The patient will maintain standing on compliant foam surface x 30 seconds with eyes closed without use of external surfaces.    Baseline Averages 3 corrections in 30 seconds  with EC on foam; can perform using verbal cues for visual imagery/spotting    Time 6    Period Weeks    Status Partially Met      PT LONG TERM GOAL #4   Title The patient will improve functional status score from 53% up to 61% demonstrating improved self perception of balance.    Baseline The patient remains at initial functional status score.    Time 6    Period Weeks    Status Not Met                   Plan - 06/25/21 1506     Clinical Impression Statement The patient has partially met LTGs and feels that he is able to continue progressing HEP on his own.  PT will f/u in 3-4 weeks to check on progress with gaze adaptation.  Patient overall notes improvement in sensation of dizziness.    PT Treatment/Interventions ADLs/Self Care Home Management;Vestibular;Patient/family education;Functional mobility training;Therapeutic activities;Therapeutic exercise;Balance training;Neuromuscular re-education;Gait training;Stair training    PT Next Visit Plan Patient to f/u in 3-4 weeks to check LTGs and progress with HEP.    PT Home Exercise Plan Access Code: [email or web address]    Consulted and Agree with Plan of Care Patient             Patient will benefit from skilled therapeutic intervention in order to improve the following deficits and impairments:     Visit Diagnosis: Dizziness and giddiness  Other abnormalities of gait and mobility  Unsteadiness on feet     Problem List Patient Active Problem List   Diagnosis Date Noted   OSA on CPAP 04/10/2021   Laceration of finger 02/26/2021   Rotator cuff tear, left 01/02/2021   Insomnia 12/31/2020   Erectile dysfunction 12/31/2020   Essential hypertension 07/05/2020   Neoplasm of uncertain behavior of skin 07/05/2020   Hyperlipidemia 07/05/2020   Benign prostatic hyperplasia without lower urinary tract symptoms 07/05/2020   Coronary artery disease involving native coronary artery of native heart without angina pectoris 07/05/2020    Panniculitis 05/08/2020   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 [place], [place], [address] Phone: [phone]   Fax:  [phone]  Name: Terry Acosta MRN: [id number] Date of Birth: Jan 16, 1951

## 2021-07-01 ENCOUNTER — Other Ambulatory Visit: Payer: Self-pay

## 2021-07-01 ENCOUNTER — Encounter: Payer: Self-pay | Admitting: Family Medicine

## 2021-07-01 ENCOUNTER — Ambulatory Visit (INDEPENDENT_AMBULATORY_CARE_PROVIDER_SITE_OTHER): Payer: Medicare Other | Admitting: Family Medicine

## 2021-07-01 VITALS — BP 134/74 | HR 61 | Temp 97.4°F | Ht 74.5 in | Wt 273.0 lb

## 2021-07-01 DIAGNOSIS — F5101 Primary insomnia: Secondary | ICD-10-CM

## 2021-07-01 DIAGNOSIS — Z1211 Encounter for screening for malignant neoplasm of colon: Secondary | ICD-10-CM | POA: Diagnosis not present

## 2021-07-01 DIAGNOSIS — N400 Enlarged prostate without lower urinary tract symptoms: Secondary | ICD-10-CM

## 2021-07-01 DIAGNOSIS — E7800 Pure hypercholesterolemia, unspecified: Secondary | ICD-10-CM

## 2021-07-01 DIAGNOSIS — L739 Follicular disorder, unspecified: Secondary | ICD-10-CM

## 2021-07-01 DIAGNOSIS — I10 Essential (primary) hypertension: Secondary | ICD-10-CM

## 2021-07-01 MED ORDER — DOXYCYCLINE HYCLATE 100 MG PO TABS: 100.0000 mg | ORAL_TABLET | Freq: Two times a day (BID) | ORAL | 0 refills | Status: AC

## 2021-07-01 NOTE — Assessment & Plan Note (Signed)
Doxycycline renewed.  He does have mupirocin ointment at home as well.

## 2021-07-01 NOTE — Progress Notes (Signed)
Devarus Hubel - 70 y.o. male MRN [id number]  Date of birth: 1951/11/10  Subjective Chief Complaint  Patient presents with   Hypertension   Hyperlipidemia    HPI Terry Acosta is a 69 year old male here today for follow-up visit.  He reports he is doing well overall.  He has had some continued issues with recurrent folliculitis.  This typically responds well to doxycycline.  He does need a refill of this.  He is due for annual lab work.  His blood pressures remain well controlled with atenolol and lisinopril.  He has not noted any side effects with this.  He does continue to see cardiology as well.  He denies anginal symptoms, headaches, vision changes.  He continues to tolerate Crestor well for management of hyperlipidemia.  He believes he is due for updated colon cancer screening.  Health maintenance does not show this to be due until 2027 however there is nothing scanned into his chart as far as previous colon cancer screening.  He is fairly confident that he is overdue.  ROS:  A comprehensive ROS was completed and negative except as noted per HPI  Allergies  Allergen Reactions   Penicillins Other (See Comments)    Syncope   Shellfish Allergy     Past Medical History:  Diagnosis Date   CAD (coronary artery disease)    Colon polyps    Diverticulitis    GERD (gastroesophageal reflux disease)    High cholesterol    Hypertension     Past Surgical History:  Procedure Laterality Date   ABDOMINAL PERINEAL BOWEL RESECTION     x 2   HERNIA REPAIR     x 6   KNEE ARTHROSCOPY Bilateral    TONSILLECTOMY      Social History   Socioeconomic History   Marital status: Married    Spouse name: Ikeem Schoenig   Number of children: 2   Years of education: 16   Highest education level: Bachelor's degree (e.g., BA, AB, BS)  Occupational History   Occupation: Self Employed   Occupation: Retired  Tobacco Use   Smoking status: Never   Smokeless tobacco: Never  Vaping Use   Vaping  Use: Never used  Substance and Sexual Activity   Alcohol use: Yes    Alcohol/week: 5.0 standard drinks    Types: 5 Standard drinks or equivalent per week   Drug use: Never   Sexual activity: Yes    Partners: Female  Other Topics Concern   Not on file  Social History Narrative   Lives with his wife. He enjoys photography, music and volunteering.   Social Determinants of Health   Financial Resource Strain: Low Risk    Difficulty of Paying Living Expenses: Not hard at all  Food Insecurity: No Food Insecurity   Worried About Charity fundraiser in the Last Year: Never true   [place] in the Last Year: Never true  Transportation Needs: No Transportation Needs   Lack of Transportation (Medical): No   Lack of Transportation (Non-Medical): No  Physical Activity: Insufficiently Active   Days of Exercise per Week: 2 days   Minutes of Exercise per Session: 60 min  Stress: No Stress Concern Present   Feeling of Stress : Not at all  Social Connections: Socially Integrated   Frequency of Communication with Friends and Family: Once a week   Frequency of Social Gatherings with Friends and Family: Three times a week   Attends Religious Services: More than 4 times per  year   Active Member of Clubs or Organizations: Yes   Attends Archivist Meetings: More than 4 times per year   Marital Status: Married    Family History  Problem Relation Age of Onset   Hypertension Father    Prostate cancer Father     Health Maintenance  Topic Date Due   COVID-19 Vaccine (4 - Booster for Moderna series) 01/01/2021   INFLUENZA VACCINE  07/01/2021   Hepatitis C Screening  05/29/2022 (Originally 02/07/1969)   TETANUS/TDAP  12/16/2023   COLONOSCOPY (Pts 45-67yr Insurance coverage will need to be confirmed)  07/30/2026   PNA vac Low Risk Adult  Completed   Zoster Vaccines- Shingrix  Completed   HPV VACCINES  Aged Out      ----------------------------------------------------------------------------------------------------------------------------------------------------------------------------------------------------------------- Physical Exam BP 134/74 (BP Location: Left Arm, Patient Position: Sitting, Cuff Size: Large)   Pulse 61   Temp (!) 97.4 F (36.3 C)   Ht 6' 2.5" (1.892 m)   Wt 273 lb (123.8 kg)   SpO2 97%   BMI 34.58 kg/m   Physical Exam Constitutional:      Appearance: Normal appearance.  Eyes:     General: No scleral icterus. Cardiovascular:     Rate and Rhythm: Normal rate and regular rhythm.  Pulmonary:     Effort: Pulmonary effort is normal.     Breath sounds: Normal breath sounds.  Musculoskeletal:     Cervical back: Neck supple.  Neurological:     General: No focal deficit present.     Mental Status: He is alert.  Psychiatric:        Mood and Affect: Mood normal.        Behavior: Behavior normal.    ------------------------------------------------------------------------------------------------------------------------------------------------------------------------------------------------------------------- Assessment and Plan  Essential hypertension Checks blood pressure remains well controlled at this time.  I recommend continuation of current medications.  Encouraged a low-sodium diet in addition to regular exercise and activity.  Benign prostatic hyperplasia without lower urinary tract symptoms Stable symptoms at this time.  Update PSA today.  Hyperlipidemia He continues to do well with Crestor at current strength.  Update lipid panel.  Insomnia Uses Vistaril as needed most of the time which continues to work well for him.  He does use Ambien occasionally if needed.  Folliculitis Doxycycline renewed.  He does have mupirocin ointment at home as well.   Meds ordered this encounter  Medications   doxycycline (VIBRA-TABS) 100 MG tablet    Sig: Take 1 tablet  (100 mg total) by mouth 2 (two) times daily for 10 days.    Dispense:  20 tablet    Refill:  0    Return in about 6 months (around 01/01/2022) for HTN.    This visit occurred during the SARS-CoV-2 public health emergency.  Safety protocols were in place, including screening questions prior to the visit, additional usage of staff PPE, and extensive cleaning of exam room while observing appropriate contact time as indicated for disinfecting solutions.

## 2021-07-01 NOTE — Assessment & Plan Note (Signed)
Stable symptoms at this time.  Update PSA today.

## 2021-07-01 NOTE — Assessment & Plan Note (Signed)
Checks blood pressure remains well controlled at this time.  I recommend continuation of current medications.  Encouraged a low-sodium diet in addition to regular exercise and activity.

## 2021-07-01 NOTE — Patient Instructions (Signed)
Great to see you! Take doxycycline for 10 days.  You should be contacted for colonoscopy appt.

## 2021-07-01 NOTE — Assessment & Plan Note (Signed)
He continues to do well with Crestor at current strength.  Update lipid panel.

## 2021-07-01 NOTE — Assessment & Plan Note (Signed)
Uses Vistaril as needed most of the time which continues to work well for him.  He does use Ambien occasionally if needed.

## 2021-07-02 ENCOUNTER — Encounter: Payer: Medicare Other | Admitting: Rehabilitative and Restorative Service Providers"

## 2021-07-05 LAB — COMPLETE METABOLIC PANEL WITH GFR
AG Ratio: 1.5 (calc) (ref 1.0–2.5)
ALT: 25 U/L (ref 9–46)
AST: 27 U/L (ref 10–35)
Albumin: 3.7 g/dL (ref 3.6–5.1)
Alkaline phosphatase (APISO): 50 U/L (ref 35–144)
BUN: 15 mg/dL (ref 7–25)
CO2: 30 mmol/L (ref 20–32)
Calcium: 8.9 mg/dL (ref 8.6–10.3)
Chloride: 107 mmol/L (ref 98–110)
Creat: 0.77 mg/dL (ref 0.70–1.28)
Globulin: 2.5 g/dL (calc) (ref 1.9–3.7)
Glucose, Bld: 106 mg/dL — ABNORMAL HIGH (ref 65–99)
Potassium: 4.7 mmol/L (ref 3.5–5.3)
Sodium: 141 mmol/L (ref 135–146)
Total Bilirubin: 0.6 mg/dL (ref 0.2–1.2)
Total Protein: 6.2 g/dL (ref 6.1–8.1)
eGFR: 95 mL/min/{1.73_m2} (ref >=60)

## 2021-07-05 LAB — CBC WITH DIFFERENTIAL/PLATELET
Absolute Monocytes: 593 cells/uL (ref 200–950)
Basophils Absolute: 108 cells/uL (ref 0–200)
Basophils Relative: 1.9 %
Eosinophils Absolute: 314 cells/uL (ref 15–500)
Eosinophils Relative: 5.5 %
HCT: 45.1 % (ref 38.5–50.0)
Hemoglobin: 14.7 g/dL (ref 13.2–17.1)
Lymphs Abs: 1818 cells/uL (ref 850–3900)
MCH: 30.1 pg (ref 27.0–33.0)
MCHC: 32.6 g/dL (ref 32.0–36.0)
MCV: 92.2 fL (ref 80.0–100.0)
MPV: 11.4 fL (ref 7.5–12.5)
Monocytes Relative: 10.4 %
Neutro Abs: 2867 cells/uL (ref 1500–7800)
Neutrophils Relative %: 50.3 %
Platelets: 192 10*3/uL (ref 140–400)
RBC: 4.89 10*6/uL (ref 4.20–5.80)
RDW: 13.5 % (ref 11.0–15.0)
Total Lymphocyte: 31.9 %
WBC: 5.7 10*3/uL (ref 3.8–10.8)

## 2021-07-05 LAB — LIPID PANEL W/REFLEX DIRECT LDL
Cholesterol: 120 mg/dL (ref <200)
HDL: 42 mg/dL (ref >=40)
LDL Cholesterol (Calc): 60 mg/dL (calc)
Non-HDL Cholesterol (Calc): 78 mg/dL (calc) (ref <130)
Total CHOL/HDL Ratio: 2.9 (calc) (ref <5.0)
Triglycerides: 92 mg/dL (ref <150)

## 2021-07-05 LAB — PSA: PSA: 1.75 ng/mL (ref <=4.00)

## 2021-07-11 ENCOUNTER — Encounter: Payer: Medicare Other | Admitting: Rehabilitative and Restorative Service Providers"

## 2021-07-15 NOTE — Progress Notes (Signed)
HPI: FU CAD.  Holter monitor 2017 showed sinus rhythm with PACs and what is described as 4-6 beats of paroxysms of atrial fibrillation.  Echocardiogram 2017 showed normal LV function and mild left ventricular hypertrophy.  Abdominal ultrasound February 2019 showed no aneurysm.  Cardiac catheterization March 2019 showed less than 50% involving the mid right coronary artery and proximal LAD.  Since last seen, he has dyspnea with more vigorous activities but not routine activities.  No orthopnea, PND, chest pain or syncope.  Minimal pedal edema.  Current Outpatient Medications  Medication Sig Dispense Refill   AMBULATORY NON FORMULARY MEDICATION Please provide autopap machine with humidifier.  Pressure 6-16 cmH20.  Please provide supplies including mask per patient choice, filters, heated and humidified tubing.  Diagnosis: OSA G47.33 1 Device 0   aspirin EC 81 MG tablet Take 1 tablet (81 mg total) by mouth daily. Swallow whole. 90 tablet 3   atenolol (TENORMIN) 50 MG tablet Take 1 tablet (50 mg total) by mouth daily. 5 tablet 0   esomeprazole (NEXIUM) 20 MG packet      furosemide (LASIX) 40 MG tablet Take 40 mg by mouth. As needed.     HYDROXYZINE PAMOATE PO      lisinopril (ZESTRIL) 5 MG tablet Take 1 tablet (5 mg total) by mouth daily. 90 tablet 2   Multiple Vitamin (MULTIVITAMIN) capsule Take 1 capsule by mouth daily.     Respiratory Therapy Supplies DEVI by Does not apply route.     rosuvastatin (CRESTOR) 40 MG tablet Take 1 tablet (40 mg total) by mouth daily. 5 tablet 0   tadalafil (CIALIS) 5 MG tablet Take 1-2 tablets (5-10 mg total) by mouth daily as needed for erectile dysfunction. Using good Rx card 30 tablet 3   zolpidem (AMBIEN) 5 MG tablet Take 1 tablet (5 mg total) by mouth at bedtime. 90 tablet 1   No current facility-administered medications for this visit.     Past Medical History:  Diagnosis Date   CAD (coronary artery disease)    Colon polyps    Diverticulitis     GERD (gastroesophageal reflux disease)    High cholesterol    Hypertension     Past Surgical History:  Procedure Laterality Date   ABDOMINAL PERINEAL BOWEL RESECTION     x 2   HERNIA REPAIR     x 6   KNEE ARTHROSCOPY Bilateral    TONSILLECTOMY      Social History   Socioeconomic History   Marital status: Married    Spouse name: Demetricus Wickersham   Number of children: 2   Years of education: 16   Highest education level: Bachelor's degree (e.g., BA, AB, BS)  Occupational History   Occupation: Self Employed   Occupation: Retired  Tobacco Use   Smoking status: Never   Smokeless tobacco: Never  Vaping Use   Vaping Use: Never used  Substance and Sexual Activity   Alcohol use: Yes    Alcohol/week: 5.0 standard drinks    Types: 5 Standard drinks or equivalent per week   Drug use: Never   Sexual activity: Yes    Partners: Female  Other Topics Concern   Not on file  Social History Narrative   Lives with his wife. He enjoys photography, music and volunteering.   Social Determinants of Health   Financial Resource Strain: Low Risk    Difficulty of Paying Living Expenses: Not hard at all  Food Insecurity: No Food Insecurity  Worried About Charity fundraiser in the Last Year: Never true   [place] in the Last Year: Never true  Transportation Needs: No Transportation Needs   Lack of Transportation (Medical): No   Lack of Transportation (Non-Medical): No  Physical Activity: Insufficiently Active   Days of Exercise per Week: 2 days   Minutes of Exercise per Session: 60 min  Stress: No Stress Concern Present   Feeling of Stress : Not at all  Social Connections: Socially Integrated   Frequency of Communication with Friends and Family: Once a week   Frequency of Social Gatherings with Friends and Family: Three times a week   Attends Religious Services: More than 4 times per year   Active Member of Clubs or Organizations: Yes   Attends Music therapist:  More than 4 times per year   Marital Status: Married  Human resources officer Violence: Not At Risk   Fear of Current or Ex-Partner: No   Emotionally Abused: No   Physically Abused: No   Sexually Abused: No    Family History  Problem Relation Age of Onset   Hypertension Father    Prostate cancer Father     ROS: no fevers or chills, productive cough, hemoptysis, dysphasia, odynophagia, melena, hematochezia, dysuria, hematuria, rash, seizure activity, orthopnea, PND, claudication. Remaining systems are negative.  Physical Exam: Well-developed well-nourished in no acute distress.  Skin is warm and dry.  HEENT is normal.  Neck is supple.  Chest is clear to auscultation with normal expansion.  Cardiovascular exam is regular rate and rhythm.  Abdominal exam nontender or distended. No masses palpated. Extremities show no edema. neuro grossly intact  ECG-normal sinus rhythm with occasional PVC, no ST changes personally reviewed  A/P  1 coronary artery disease-patient denies chest pain.  Continue aspirin and statin.  2 hyperlipidemia-continue statin.  3 hypertension-patient's blood pressure is elevated.  Increase lisinopril to 10 mg daily.  In 1 week check potassium and renal function.  Adjust regimen as needed.  4 question history of atrial fibrillation-previous Holter monitor at an outside institution described 4 and 6 beat runs of atrial fibrillation.  As outlined in previous notes this would seem very short for true atrial fibrillation.  We have not been able to obtain the strips to verify.  We will follow for now.  Kirk Ruths, MD

## 2021-07-24 ENCOUNTER — Encounter: Payer: Self-pay | Admitting: Cardiology

## 2021-07-24 ENCOUNTER — Ambulatory Visit (INDEPENDENT_AMBULATORY_CARE_PROVIDER_SITE_OTHER): Payer: Medicare Other | Admitting: Cardiology

## 2021-07-24 ENCOUNTER — Other Ambulatory Visit: Payer: Self-pay

## 2021-07-24 VITALS — BP 144/82 | HR 66 | Ht 74.5 in | Wt 270.1 lb

## 2021-07-24 DIAGNOSIS — E7800 Pure hypercholesterolemia, unspecified: Secondary | ICD-10-CM

## 2021-07-24 DIAGNOSIS — I2510 Atherosclerotic heart disease of native coronary artery without angina pectoris: Secondary | ICD-10-CM

## 2021-07-24 DIAGNOSIS — I10 Essential (primary) hypertension: Secondary | ICD-10-CM | POA: Diagnosis not present

## 2021-07-24 MED ORDER — LISINOPRIL 10 MG PO TABS: 10.0000 mg | ORAL_TABLET | Freq: Every day | ORAL | 3 refills | Status: DC

## 2021-07-24 NOTE — Patient Instructions (Signed)
Medication Instructions:   INCREASE LISINOPRIL TO 10 MG ONCE DAILY = 2 OF THE 5 MG TABLETS ONCE DAILY  *If you need a refill on your cardiac medications before your next appointment, please call your pharmacy*   Lab Work:  Your physician recommends that you return for lab work in: [place]  If you have labs (blood work) drawn today and your tests are completely normal, you will receive your results only by: [employer] (if you have MyChart) OR A paper copy in the mail If you have any lab test that is abnormal or we need to change your treatment, we will call you to review the results.   Follow-Up: At [facility], you and your health needs are our priority.  As part of our continuing mission to provide you with exceptional heart care, we have created designated Provider Care Teams.  These Care Teams include your primary Cardiologist (physician) and Advanced Practice Providers (APPs -  Physician Assistants and Nurse Practitioners) who all work together to provide you with the care you need, when you need it.  We recommend signing up for the patient portal called "MyChart".  Sign up information is provided on this After Visit Summary.  MyChart is used to connect with patients for Virtual Visits (Telemedicine).  Patients are able to view lab/test results, encounter notes, upcoming appointments, etc.  Non-urgent messages can be sent to your provider as well.   To learn more about what you can do with MyChart, go to [email or web address].    Your next appointment:   12 month(s)  The format for your next appointment:   In Person  Provider:   Kirk Ruths, MD

## 2021-07-29 ENCOUNTER — Encounter: Payer: Self-pay | Admitting: Family Medicine

## 2021-07-30 ENCOUNTER — Ambulatory Visit (INDEPENDENT_AMBULATORY_CARE_PROVIDER_SITE_OTHER): Payer: Medicare Other | Admitting: Rehabilitative and Restorative Service Providers"

## 2021-07-30 ENCOUNTER — Encounter: Payer: Self-pay | Admitting: Rehabilitative and Restorative Service Providers"

## 2021-07-30 ENCOUNTER — Other Ambulatory Visit: Payer: Self-pay

## 2021-07-30 DIAGNOSIS — R42 Dizziness and giddiness: Secondary | ICD-10-CM | POA: Diagnosis present

## 2021-07-30 DIAGNOSIS — R2689 Other abnormalities of gait and mobility: Secondary | ICD-10-CM

## 2021-07-30 DIAGNOSIS — R2681 Unsteadiness on feet: Secondary | ICD-10-CM

## 2021-07-30 NOTE — Patient Instructions (Signed)
Access Code: [email or web address] URL: [email or web address] Date: 07/30/2021 Prepared by: Rudell Cobb  Program Notes LETTER EXERCISE:  increase up to 2 minutes adding time slowly (go from 1 minute, to 75 seconds.Marland KitchenMarland KitchenMarland KitchenMarland Kitchen)     Exercises Standing Gaze Stabilization with Head Rotation - 1 x daily - 7 x weekly - 3 sets - 1 reps - 60 seconds hold Standing Gaze Stabilization with Head Nod - 1 x daily - 7 x weekly - 3 sets - 1 reps - 60 seconds hold Romberg Stance with Eyes Closed - 1 x daily - 7 x weekly - 1 sets - 3 reps - 30 seconds hold Single Leg Stance - 1 x daily - 7 x weekly - 1 sets - 2 reps - 10-15 seconds hold Standing Heel Raise with Support - 1 x daily - 7 x weekly - 1 sets - 10-15 reps Standing Quarter Turn - 1 x daily - 7 x weekly - 1 sets - 10 reps Wide Stance with Head Nods on Foam Pad - 1 x daily - 7 x weekly - 1 sets - 10 reps Wide Stance with Head Rotation on Foam Pad - 1 x daily - 7 x weekly - 1 sets - 10 reps Single Leg Balance on Foam Pad - 1 x daily - 7 x weekly - 1 sets - 3 reps Seated Ankle Inversion with Anchored Resistance - 1 x daily - 3 x weekly - 1 sets - 10 reps Seated Ankle Eversion with Resistance - 1 x daily - 3 x weekly - 1 sets - 10 reps

## 2021-07-30 NOTE — Therapy (Signed)
[facility] [place], [place], [address] Phone: [phone]   Fax:  [phone]  Physical Therapy Treatment and Discharge Summary  Patient Details  Name: Wilkin Lippy MRN: [id number] Date of Birth: [birth year] Referring Provider (PT): Luetta Nutting, DO   PHYSICAL THERAPY DISCHARGE SUMMARY  Visits from Start of Care: 5  Current functional level related to goals / functional outcomes: Met LTGs- see below.   Remaining deficits: Chronic multi-sensory balance impairments with reliance on visual cues.    Education / Equipment: PT has educated in comprehensive HEP, balance progression.   Patient agrees to discharge. Patient goals were met. Patient is being discharged due to meeting the stated rehab goals.   Encounter Date: 07/30/2021   PT End of Session - 07/30/21 0919     Visit Number 5    Number of Visits 6    Authorization Type medicare A and B    Progress Note Due on Visit 10    PT Start Time 0847    PT Stop Time 0917    PT Time Calculation (min) 30 min    Activity Tolerance Patient tolerated treatment well    Behavior During Therapy WFL for tasks assessed/performed             Past Medical History:  Diagnosis Date   CAD (coronary artery disease)    Colon polyps    Diverticulitis    GERD (gastroesophageal reflux disease)    High cholesterol    Hypertension     Past Surgical History:  Procedure Laterality Date   ABDOMINAL PERINEAL BOWEL RESECTION     x 2   HERNIA REPAIR     x 6   KNEE ARTHROSCOPY Bilateral    TONSILLECTOMY      There were no vitals filed for this visit.   Subjective Assessment - 07/30/21 0849     Subjective The patient reports he was doing well with balance/vestibular issues.  He went to cardiologist and they modified BP meds and he has felt he is staggering around the house.  The football games went well with photography.    Patient Stated Goals "I want  better balance."    Currently in Pain? No/denies                [facility] PT Assessment - 07/30/21 0851       Assessment   Medical Diagnosis vertigo    Referring Provider (PT) Luetta Nutting, DO    Onset Date/Surgical Date 05/31/21    Hand Dominance Right      Precautions   Precautions None                           [facility] Adult PT Treatment/Exercise - 07/30/21 7824       Ambulation/Gait   Ambulation/Gait Yes      Neuro Re-ed    Neuro Re-ed Details  Compliant surface standing with eyes open / eyes closed; romberg with eyes closed.  Discussed home/ work activities with quick turns still challenging.             Vestibular Treatment/Exercise - 07/30/21 2353       Vestibular Treatment/Exercise   Vestibular Treatment Provided Habituation;Gaze    Habituation Exercises 180 degree Turns    Gaze Exercises X1 Viewing Horizontal;X1 Viewing Vertical      180 degree Turns   Number of Reps  5      X1 Viewing Horizontal  Foot Position standing    Comments 45 seconds      X1 Viewing Vertical   Foot Position standing    Comments 45 seconds                   PT Education - 07/30/21 0919     Education Details updated HEP for d/c    Person(s) Educated Patient    Methods Explanation;Demonstration;Handout    Comprehension Verbalized understanding;Returned demonstration                 PT Long Term Goals - 07/30/21 0908       PT LONG TERM GOAL #1   Title The patient will be indep with HEP for gaze adaptation, motion sensitivity and multi-sensory balance.    Time 6    Period Weeks    Status Achieved      PT LONG TERM GOAL #2   Title The patient will improve SVA versus DVA from 5 line difference to < or equal to 3 line difference demonstrating improved use of vestibular ocular reflex.    Baseline 4 line difference on 06/25/21    Time 6    Period Weeks    Status Partially Met      PT LONG TERM GOAL #3   Title The patient will  maintain standing on compliant foam surface x 30 seconds with eyes closed without use of external surfaces.    Baseline met on 07/30/21    Time 6    Period Weeks    Status Achieved      PT LONG TERM GOAL #4   Title The patient will improve functional status score from 53% up to 61% demonstrating improved self perception of balance.    Baseline 64%    Time 6    Period Weeks    Status Achieved                   Plan - 07/30/21 8916     Clinical Impression Statement The patient has partially met LTGs.  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  Patient to continue current HEP as all activities are still appropriate.  We discussed visual compensatory strategies to improve function.    PT Treatment/Interventions ADLs/Self Care Home Management;Vestibular;Patient/family education;Functional mobility training;Therapeutic activities;Therapeutic exercise;Balance training;Neuromuscular re-education;Gait training;Stair training    PT Next Visit Plan discharge    PT Home Exercise Plan Access Code: [email or web address]    [email or web address] and Agree with Plan of Care Patient             Patient will benefit from skilled therapeutic intervention in order to improve the following deficits and impairments:     Visit Diagnosis: Dizziness and giddiness  Other abnormalities of gait and mobility  Unsteadiness on feet     Problem List Patient Active Problem List   Diagnosis Date Noted   OSA on CPAP 04/10/2021   Laceration of finger 02/26/2021   Rotator cuff tear, left 01/02/2021   Insomnia 12/31/2020   Erectile dysfunction 91/50/5697   Folliculitis 94/80/1655   Essential hypertension 07/05/2020   Neoplasm of uncertain behavior of skin 07/05/2020   Hyperlipidemia 07/05/2020   Benign prostatic hyperplasia without lower urinary tract symptoms 07/05/2020   Coronary artery disease involving native coronary artery of  native heart without angina pectoris 07/05/2020   Panniculitis 05/08/2020   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

## 2021-08-02 ENCOUNTER — Other Ambulatory Visit: Payer: Self-pay | Admitting: Family Medicine

## 2021-08-02 MED ORDER — FLUTICASONE-SALMETEROL 250-50 MCG/ACT IN AEPB: 1.0000 | INHALATION_SPRAY | Freq: Two times a day (BID) | RESPIRATORY_TRACT | 3 refills | Status: DC

## 2021-08-07 LAB — BASIC METABOLIC PANEL
BUN: 13 mg/dL (ref 7–25)
CO2: 30 mmol/L (ref 20–32)
Calcium: 9 mg/dL (ref 8.6–10.3)
Chloride: 108 mmol/L (ref 98–110)
Creat: 0.76 mg/dL (ref 0.70–1.28)
Glucose, Bld: 119 mg/dL — ABNORMAL HIGH (ref 65–99)
Potassium: 4.4 mmol/L (ref 3.5–5.3)
Sodium: 143 mmol/L (ref 135–146)

## 2021-08-08 ENCOUNTER — Other Ambulatory Visit: Payer: Self-pay | Admitting: Family Medicine

## 2021-08-09 MED ORDER — ROSUVASTATIN CALCIUM 40 MG PO TABS: 40.0000 mg | ORAL_TABLET | Freq: Every day | ORAL | 3 refills | Status: DC

## 2021-09-13 ENCOUNTER — Ambulatory Visit (INDEPENDENT_AMBULATORY_CARE_PROVIDER_SITE_OTHER): Payer: Medicare Other | Admitting: Family Medicine

## 2021-09-13 ENCOUNTER — Encounter: Payer: Self-pay | Admitting: Family Medicine

## 2021-09-13 VITALS — BP 136/77 | HR 68 | Ht 75.0 in | Wt 285.0 lb

## 2021-09-13 DIAGNOSIS — T7840XA Allergy, unspecified, initial encounter: Secondary | ICD-10-CM

## 2021-09-13 DIAGNOSIS — R062 Wheezing: Secondary | ICD-10-CM | POA: Diagnosis not present

## 2021-09-13 DIAGNOSIS — J019 Acute sinusitis, unspecified: Secondary | ICD-10-CM | POA: Diagnosis not present

## 2021-09-13 DIAGNOSIS — I2510 Atherosclerotic heart disease of native coronary artery without angina pectoris: Secondary | ICD-10-CM

## 2021-09-13 MED ORDER — AZITHROMYCIN 250 MG PO TABS: ORAL_TABLET | ORAL | 0 refills | Status: AC

## 2021-09-13 MED ORDER — PREDNISONE 20 MG PO TABS: 40.0000 mg | ORAL_TABLET | Freq: Every day | ORAL | 0 refills | Status: DC

## 2021-09-13 NOTE — Progress Notes (Signed)
Acute Office Visit  Subjective:    Patient ID: Terry Acosta, male    DOB: [birth year], 70 y.o.   MRN: [id number]  Chief Complaint  Patient presents with   Nasal Congestion    HPI Patient is in today for congestion and ear pain.  He has had symptoms for about 10 days at this point.  His left ear just feels full.  He feels like his scrotum has been scratchy he has been getting some yellow and white-colored sputum as well as nasal drainage has had tons of sinus drainage no shortness of breath.  No GI symptoms.  He has noticed wheezing.  He has been using his inhaler but says it really does not seem to make a difference.  I do not see a diagnosis of COPD or asthma.  It looks like he just had full PFTs in April at the [facility] and they were essentially normal.   Past Medical History:  Diagnosis Date   CAD (coronary artery disease)    Colon polyps    Diverticulitis    GERD (gastroesophageal reflux disease)    High cholesterol    Hypertension     Past Surgical History:  Procedure Laterality Date   ABDOMINAL PERINEAL BOWEL RESECTION     x 2   HERNIA REPAIR     x 6   KNEE ARTHROSCOPY Bilateral    TONSILLECTOMY      Family History  Problem Relation Age of Onset   Hypertension Father    Prostate cancer Father     Social History   Socioeconomic History   Marital status: Married    Spouse name: Chaseton Yepiz   Number of children: 2   Years of education: 16   Highest education level: Bachelor's degree (e.g., BA, AB, BS)  Occupational History   Occupation: Self Employed   Occupation: Retired  Tobacco Use   Smoking status: Never   Smokeless tobacco: Never  Vaping Use   Vaping Use: Never used  Substance and Sexual Activity   Alcohol use: Yes    Alcohol/week: 5.0 standard drinks    Types: 5 Standard drinks or equivalent per week   Drug use: Never   Sexual activity: Yes    Partners: Female  Other Topics Concern   Not on file  Social History  Narrative   Lives with his wife. He enjoys photography, music and volunteering.   Social Determinants of Health   Financial Resource Strain: Low Risk    Difficulty of Paying Living Expenses: Not hard at all  Food Insecurity: No Food Insecurity   Worried About Charity fundraiser in the Last Year: Never true   [place] in the Last Year: Never true  Transportation Needs: No Transportation Needs   Lack of Transportation (Medical): No   Lack of Transportation (Non-Medical): No  Physical Activity: Insufficiently Active   Days of Exercise per Week: 2 days   Minutes of Exercise per Session: 60 min  Stress: No Stress Concern Present   Feeling of Stress : Not at all  Social Connections: Socially Integrated   Frequency of Communication with Friends and Family: Once a week   Frequency of Social Gatherings with Friends and Family: Three times a week   Attends Religious Services: More than 4 times per year   Active Member of Clubs or Organizations: Yes   Attends Archivist Meetings: More than 4 times per year   Marital Status: Married  Human resources officer Violence:  Not At Risk   Fear of Current or Ex-Partner: No   Emotionally Abused: No   Physically Abused: No   Sexually Abused: No    Outpatient Medications Prior to Visit  Medication Sig Dispense Refill   AMBULATORY NON FORMULARY MEDICATION Please provide autopap machine with humidifier.  Pressure 6-16 cmH20.  Please provide supplies including mask per patient choice, filters, heated and humidified tubing.  Diagnosis: OSA G47.33 1 Device 0   aspirin EC 81 MG tablet Take 1 tablet (81 mg total) by mouth daily. Swallow whole. 90 tablet 3   atenolol (TENORMIN) 50 MG tablet Take 1 tablet (50 mg total) by mouth daily. 5 tablet 0   esomeprazole (NEXIUM) 20 MG packet      fluticasone-salmeterol (WIXELA INHUB) 250-50 MCG/ACT AEPB Inhale 1 puff into the lungs in the morning and at bedtime. 60 each 3   furosemide (LASIX) 40 MG tablet  Take 40 mg by mouth. As needed.     HYDROXYZINE PAMOATE PO      lisinopril (ZESTRIL) 10 MG tablet Take 1 tablet (10 mg total) by mouth daily. 90 tablet 3   Multiple Vitamin (MULTIVITAMIN) capsule Take 1 capsule by mouth daily.     Respiratory Therapy Supplies DEVI by Does not apply route.     rosuvastatin (CRESTOR) 40 MG tablet Take 1 tablet (40 mg total) by mouth daily. 90 tablet 3   tadalafil (CIALIS) 5 MG tablet Take 1-2 tablets (5-10 mg total) by mouth daily as needed for erectile dysfunction. Using good Rx card 30 tablet 3   zolpidem (AMBIEN) 5 MG tablet Take 1 tablet (5 mg total) by mouth at bedtime. 90 tablet 1   No facility-administered medications prior to visit.    Allergies  Allergen Reactions   Penicillins Other (See Comments)    Syncope   Shellfish Allergy     Review of Systems     Objective:    Physical Exam Constitutional:      Appearance: He is well-developed.  HENT:     Head: Normocephalic and atraumatic.     Right Ear: External ear normal.     Left Ear: External ear normal.     Nose: Nose normal.  Eyes:     Conjunctiva/sclera: Conjunctivae normal.     Pupils: Pupils are equal, round, and reactive to light.  Neck:     Thyroid: No thyromegaly.  Cardiovascular:     Rate and Rhythm: Normal rate.     Heart sounds: Normal heart sounds.  Pulmonary:     Effort: Pulmonary effort is normal.     Breath sounds: Normal breath sounds.  Musculoskeletal:     Cervical back: Neck supple.  Lymphadenopathy:     Cervical: No cervical adenopathy.  Skin:    General: Skin is warm and dry.  Neurological:     Mental Status: He is alert and oriented to person, place, and time.    BP 136/77   Pulse 68   Ht '6\' 3"'  (1.905 m)   Wt 285 lb (129.3 kg)   SpO2 96%   BMI 35.62 kg/m  Wt Readings from Last 3 Encounters:  09/13/21 285 lb (129.3 kg)  07/24/21 270 lb 1.9 oz (122.5 kg)  07/01/21 273 lb (123.8 kg)    Health Maintenance Due  Topic Date Due   COVID-19 Vaccine  (4 - Booster for Moderna series) 12/24/2020    There are no preventive care reminders to display for this patient.   No results found for: TSH  Lab Results  Component Value Date   WBC 5.7 07/04/2021   HGB 14.7 07/04/2021   HCT 45.1 07/04/2021   MCV 92.2 07/04/2021   PLT 192 07/04/2021   Lab Results  Component Value Date   NA 143 08/06/2021   K 4.4 08/06/2021   CO2 30 08/06/2021   GLUCOSE 119 (H) 08/06/2021   BUN 13 08/06/2021   CREATININE 0.76 08/06/2021   BILITOT 0.6 07/04/2021   AST 27 07/04/2021   ALT 25 07/04/2021   PROT 6.2 07/04/2021   CALCIUM 9.0 08/06/2021   EGFR 95 07/04/2021   Lab Results  Component Value Date   CHOL 120 07/04/2021   Lab Results  Component Value Date   HDL 42 07/04/2021   Lab Results  Component Value Date   LDLCALC 60 07/04/2021   Lab Results  Component Value Date   TRIG 92 07/04/2021   Lab Results  Component Value Date   CHOLHDL 2.9 07/04/2021   No results found for: HGBA1C     Assessment & Plan:   Problem List Items Addressed This Visit       Other   Wheezing due to allergy   Other Visit Diagnoses     Acute non-recurrent sinusitis, unspecified location    -  Primary   Relevant Medications   azithromycin (ZITHROMAX) 250 MG tablet   predniSONE (DELTASONE) 20 MG tablet       Acute sinusitis-we will treat with azithromycin I did not hear wheezing on exam but he reports wheezing so we will get a go ahead and treat with a round of prednisone as well.  If not improving after the weekend then please let us know.  Continue symptomatic care.  Meds ordered this encounter  Medications   azithromycin (ZITHROMAX) 250 MG tablet    Sig: 2 Ttabs PO on Day 1, then one a day x 4 days.    Dispense:  6 tablet    Refill:  0   predniSONE (DELTASONE) 20 MG tablet    Sig: Take 2 tablets (40 mg total) by mouth daily with breakfast.    Dispense:  10 tablet    Refill:  0      Beatrice Lecher, MD

## 2021-09-13 NOTE — Progress Notes (Signed)
His sxs began 2 wks ago. Nasal congestion and drainage this is clear. He has this year around.  Not taking any OTC medication  Denies any f/s/c/headaches,body aches

## 2021-10-04 IMAGING — DX DG SHOULDER 2+V*L*
3 series · 3 of 3 positions shown · non-contrast
Comparison: None.

CLINICAL DATA: Posterior left shoulder pain for 3 months

EXAM:
LEFT SHOULDER - 2+ VIEW

[shoulder grashey]
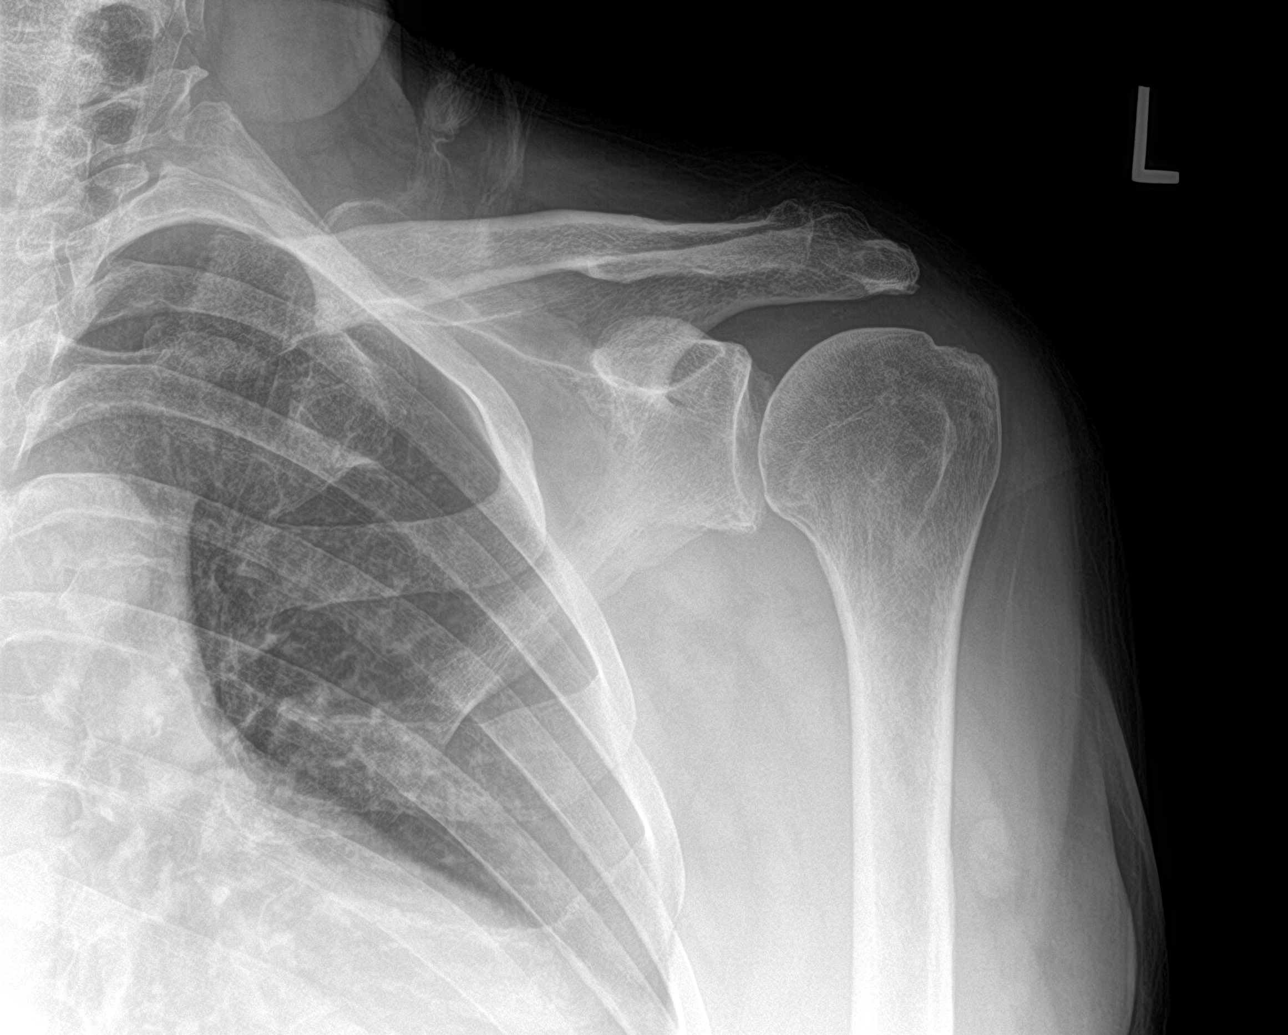

[shoulder y view]
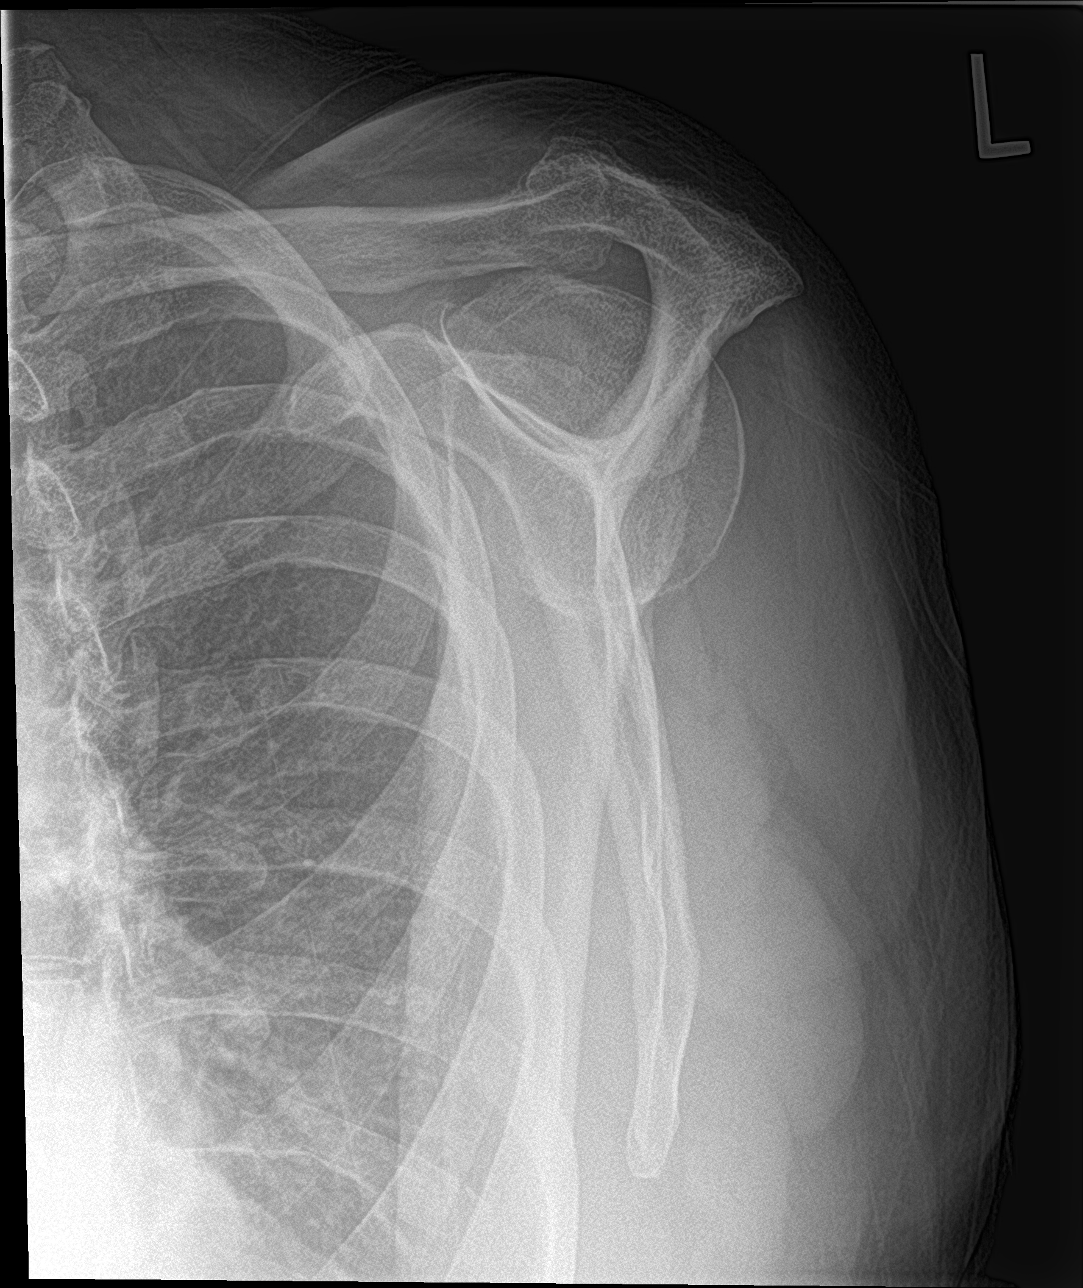

[shoulder axillary]
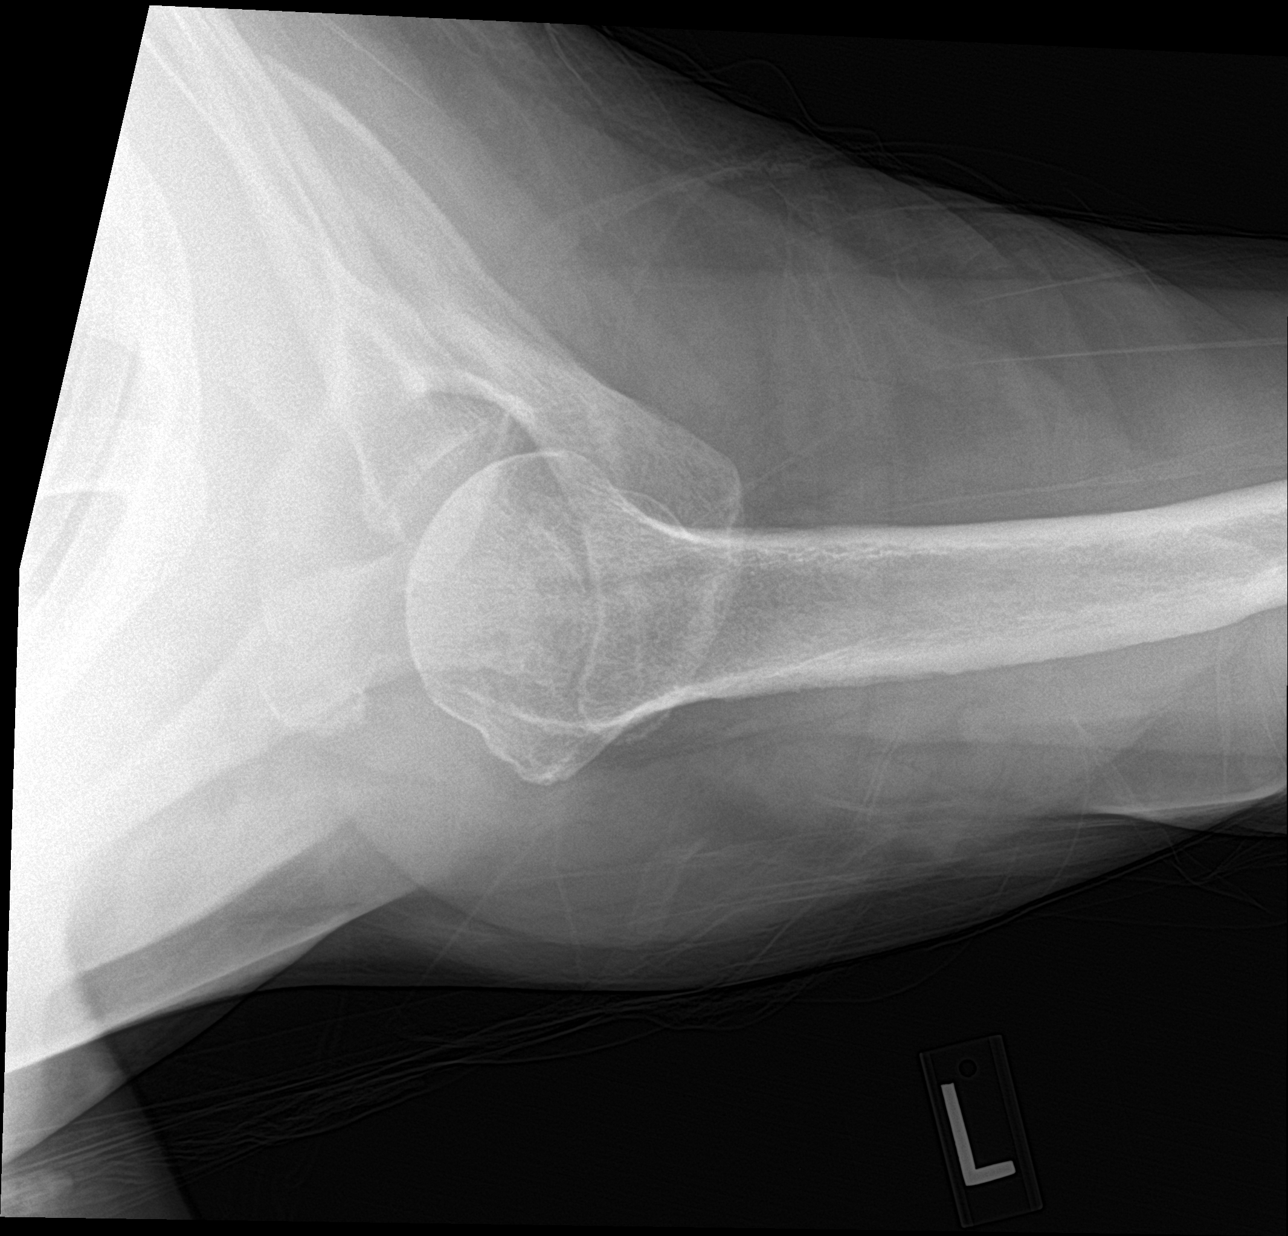

[3 of 3 positions shown; findings below may reference images not displayed]

FINDINGS: There is no evidence of fracture or dislocation. Mild arthropathy of
the acromioclavicular joint. Glenohumeral joint space maintained.
Soft tissues are unremarkable.
IMPRESSION: Mild arthropathy of the left acromioclavicular joint.

## 2021-10-10 ENCOUNTER — Encounter: Payer: Self-pay | Admitting: Family Medicine

## 2021-10-15 ENCOUNTER — Encounter: Payer: Self-pay | Admitting: Family Medicine

## 2021-10-16 MED ORDER — ZOLPIDEM TARTRATE 5 MG PO TABS: 5.0000 mg | ORAL_TABLET | Freq: Every day | ORAL | 1 refills | Status: DC

## 2021-12-27 DIAGNOSIS — H2512 Age-related nuclear cataract, left eye: Secondary | ICD-10-CM | POA: Diagnosis not present

## 2021-12-27 DIAGNOSIS — H524 Presbyopia: Secondary | ICD-10-CM | POA: Diagnosis not present

## 2022-01-01 ENCOUNTER — Ambulatory Visit (INDEPENDENT_AMBULATORY_CARE_PROVIDER_SITE_OTHER): Payer: Medicare Other | Admitting: Family Medicine

## 2022-01-01 ENCOUNTER — Other Ambulatory Visit: Payer: Self-pay

## 2022-01-01 ENCOUNTER — Encounter: Payer: Self-pay | Admitting: Family Medicine

## 2022-01-01 VITALS — BP 137/74 | HR 58 | Ht 75.0 in | Wt 277.0 lb

## 2022-01-01 DIAGNOSIS — L739 Follicular disorder, unspecified: Secondary | ICD-10-CM | POA: Diagnosis not present

## 2022-01-01 DIAGNOSIS — R062 Wheezing: Secondary | ICD-10-CM

## 2022-01-01 DIAGNOSIS — R42 Dizziness and giddiness: Secondary | ICD-10-CM | POA: Diagnosis not present

## 2022-01-01 DIAGNOSIS — T7840XA Allergy, unspecified, initial encounter: Secondary | ICD-10-CM | POA: Diagnosis not present

## 2022-01-01 DIAGNOSIS — I10 Essential (primary) hypertension: Secondary | ICD-10-CM

## 2022-01-01 MED ORDER — DOXYCYCLINE HYCLATE 100 MG PO TABS: 100.0000 mg | ORAL_TABLET | Freq: Two times a day (BID) | ORAL | 0 refills | Status: AC

## 2022-01-01 NOTE — Assessment & Plan Note (Signed)
And a course of doxycycline.  He will let me know if not improving.

## 2022-01-01 NOTE — Patient Instructions (Signed)
Add doxycycline 100mg  twice per day for 10 days.  Continue to check BP and Heart rate if feeling dizzy.  Incorporate vertigo exercises back in.  Schedule appt with Dr. Stanford Breed.

## 2022-01-01 NOTE — Progress Notes (Signed)
Terry Acosta - 71 y.o. male MRN [id number]  Date of birth: 11/23/1951  Subjective Chief Complaint  Patient presents with   Hypertension     Terry Acosta is a 71 year old male here today for follow-up visit.  Few months ago his watch indicated that he may have atrial fibrillation due to some elevated heart rate readings.  Since that time he has experienced some episodes of fatigue.  He has also had some dizziness however has had problems with vertigo in the past.  He has not noticed any chest pain or palpitations.  He has not scheduled a follow-up with his cardiologist yet.  Blood pressure has been fairly well controlled with current medications.  He is tolerating these well.  He has noticed some bumps in his pubic area which she describes as "pubic acne".  Has scattered pustules.  Some areas have increased pain.  He continues to do well with current inhalers.  No increased symptoms.  ROS:  A comprehensive ROS was completed and negative except as noted per HPI  Allergies  Allergen Reactions   Penicillins Other (See Comments)    Syncope   Shellfish Allergy     Past Medical History:  Diagnosis Date   CAD (coronary artery disease)    Colon polyps    Diverticulitis    GERD (gastroesophageal reflux disease)    High cholesterol    Hypertension     Past Surgical History:  Procedure Laterality Date   ABDOMINAL PERINEAL BOWEL RESECTION     x 2   HERNIA REPAIR     x 6   KNEE ARTHROSCOPY Bilateral    TONSILLECTOMY      Social History   Socioeconomic History   Marital status: Married    Spouse name: Terry Acosta   Number of children: 2   Years of education: 16   Highest education level: Bachelor's degree (e.g., BA, AB, BS)  Occupational History   Occupation: Self Employed   Occupation: Retired  Tobacco Use   Smoking status: Never   Smokeless tobacco: Never  Vaping Use   Vaping Use: Never used  Substance and Sexual Activity   Alcohol use: Yes    Alcohol/week: 5.0  standard drinks    Types: 5 Standard drinks or equivalent per week   Drug use: Never   Sexual activity: Yes    Partners: Female  Other Topics Concern   Not on file  Social History Narrative   Lives with his wife. He enjoys photography, music and volunteering.   Social Determinants of Health   Financial Resource Strain: Low Risk    Difficulty of Paying Living Expenses: Not hard at all  Food Insecurity: No Food Insecurity   Worried About Charity fundraiser in the Last Year: Never true   [place] in the Last Year: Never true  Transportation Needs: No Transportation Needs   Lack of Transportation (Medical): No   Lack of Transportation (Non-Medical): No  Physical Activity: Insufficiently Active   Days of Exercise per Week: 2 days   Minutes of Exercise per Session: 60 min  Stress: No Stress Concern Present   Feeling of Stress : Not at all  Social Connections: Socially Integrated   Frequency of Communication with Friends and Family: Once a week   Frequency of Social Gatherings with Friends and Family: Three times a week   Attends Religious Services: More than 4 times per year   Active Member of Clubs or Organizations: Yes   Attends Archivist  Meetings: More than 4 times per year   Marital Status: Married    Family History  Problem Relation Age of Onset   Hypertension Father    Prostate cancer Father     Health Maintenance  Topic Date Due   Pneumonia Vaccine 46+ Years old (2 - PPSV23 if available, else PCV20) 08/31/2018   COVID-19 Vaccine (4 - Booster for Moderna series) 11/26/2020   Hepatitis C Screening  05/29/2022 (Originally 02/07/1969)   TETANUS/TDAP  12/16/2023   COLONOSCOPY (Pts 45-48yrs Insurance coverage will need to be confirmed)  07/30/2026   INFLUENZA VACCINE  Completed   Zoster Vaccines- Shingrix  Completed   HPV VACCINES  Aged Out      ----------------------------------------------------------------------------------------------------------------------------------------------------------------------------------------------------------------- Physical Exam BP 137/74 (BP Location: Left Arm, Patient Position: Sitting, Cuff Size: Large)    Pulse (!) 58    Ht 6\' 3"  (1.905 m)    Wt 277 lb (125.6 kg)    SpO2 98%    BMI 34.62 kg/m   Physical Exam Constitutional:      Appearance: Normal appearance.  Eyes:     General: No scleral icterus. Cardiovascular:     Rate and Rhythm: Normal rate and regular rhythm.  Pulmonary:     Effort: Pulmonary effort is normal.     Breath sounds: Normal breath sounds.  Neurological:     General: No focal deficit present.     Mental Status: He is alert. Mental status is at baseline.     Cranial Nerves: No cranial nerve deficit.     Comments: Negative Dix-Hallpike bilaterally.  Psychiatric:        Mood and Affect: Mood normal.        Behavior: Behavior normal.   EKG: Sinus bradycardia.  No significant changes from tracing in August 2022. ------------------------------------------------------------------------------------------------------------------------------------------------------------------------------------------------------------------- Assessment and Plan  Essential hypertension Blood pressure remains fairly well controlled with current medications.  Recommend continuation of current medications at current strength  Dizziness Dizziness seems to be more vertiginous in nature.  Questionable A-fib on single-lead EKG from his watch, encouraged to follow-up with cardiology.  EKG today with sinus bradycardia, no acute changes from tracing in August 2022  Wheezing due to allergy Doing well with current inhalers.  Recommend continuation  Folliculitis And a course of doxycycline.  He will let me know if not improving.   Meds ordered this encounter  Medications   doxycycline  (VIBRA-TABS) 100 MG tablet    Sig: Take 1 tablet (100 mg total) by mouth 2 (two) times daily for 10 days.    Dispense:  20 tablet    Refill:  0    Return in about 6 months (around 07/01/2022) for HTN.    This visit occurred during the SARS-CoV-2 public health emergency.  Safety protocols were in place, including screening questions prior to the visit, additional usage of staff PPE, and extensive cleaning of exam room while observing appropriate contact time as indicated for disinfecting solutions.

## 2022-01-01 NOTE — Assessment & Plan Note (Signed)
Doing well with current inhalers.  Recommend continuation

## 2022-01-01 NOTE — Assessment & Plan Note (Addendum)
Dizziness seems to be more vertiginous in nature.  Questionable A-fib on single-lead EKG from his watch, encouraged to follow-up with cardiology.  EKG today with sinus bradycardia, no acute changes from tracing in August 2022

## 2022-01-01 NOTE — Assessment & Plan Note (Signed)
Blood pressure remains fairly well controlled with current medications.  Recommend continuation of current medications at current strength

## 2022-01-16 DIAGNOSIS — R609 Edema, unspecified: Secondary | ICD-10-CM | POA: Diagnosis not present

## 2022-01-16 DIAGNOSIS — I480 Paroxysmal atrial fibrillation: Secondary | ICD-10-CM | POA: Diagnosis not present

## 2022-01-16 DIAGNOSIS — E782 Mixed hyperlipidemia: Secondary | ICD-10-CM | POA: Diagnosis not present

## 2022-01-16 DIAGNOSIS — I10 Essential (primary) hypertension: Secondary | ICD-10-CM | POA: Diagnosis not present

## 2022-01-22 DIAGNOSIS — L570 Actinic keratosis: Secondary | ICD-10-CM | POA: Diagnosis not present

## 2022-01-22 DIAGNOSIS — B351 Tinea unguium: Secondary | ICD-10-CM | POA: Diagnosis not present

## 2022-01-22 DIAGNOSIS — L578 Other skin changes due to chronic exposure to nonionizing radiation: Secondary | ICD-10-CM | POA: Diagnosis not present

## 2022-02-03 ENCOUNTER — Telehealth: Payer: Self-pay | Admitting: Cardiology

## 2022-02-03 NOTE — Telephone Encounter (Signed)
STAT if patient feels like he/she is going to faint  ? ?Are you dizzy now? Yes, all the time ? ?Do you feel faint or have you passed out? no ? ?Do you have any other symptoms? no ? ?Have you checked your HR and BP (record if available)? 145-160/75's ? ? ?Patient states he has been having dizziness since he started being seen by Dr.Crenshaw, but he states it seems to be worse. He says his medications don't seem to help and he is tired of being dizzy all the time. He says he he came close to fainting once, but has not passed out. ?

## 2022-02-03 NOTE — Telephone Encounter (Signed)
Left message to call back  

## 2022-02-04 NOTE — Telephone Encounter (Signed)
Returned the call to the patient. His wife stated that he was busy driving so could not talk. She stated that the patient needed a follow up with Dr. Stanford Breed due to increased blood pressure and dizziness. The patient was offered an appointment this week but declined due to being on the way out of town for the week. ? ?The wife stated that he was unable to provide any readings due to driving. An appointment has been made for 3/15 with Fabian Sharp, [place]. He has been advised to call back when able to discuss the blood pressures if needed.  ?

## 2022-02-04 NOTE — Telephone Encounter (Signed)
Patient is returning call.  °

## 2022-02-09 NOTE — Progress Notes (Unsigned)
Cardiology Office Note:    Date:  02/09/2022   ID:  Terry Acosta, DOB 07/16/1951, MRN [id number]  PCP:  Luetta Nutting, DO   [facility] Providers Cardiologist:  Kirk Ruths, MD { Click to update primary MD,subspecialty MD or APP then REFRESH:1}    Referring MD: Luetta Nutting, DO   No chief complaint on file. ***  History of Present Illness:    Terry Acosta is a 71 y.o. male with a hx of nonobstructive CAD by heart cath March 2019 with less than 50% stenosis in the mid RCA and proximal LAD. Prior nuclear stress test 2015 with fixed defect in the inferior wall suspected to be attenuation artifact, no ischemia.   Echocardiogram 2017 showed normal LVEF and mild LVH.  Heart monitor 2017 with sinus rhythm and PACs and possible PAF lasting only 4 to 6 beats.  This heart monitor was performed at an outside institution and Dr. Stanford Breed question true A-fib if it only lasted 4 or 6 beats at a time.  He is not anticoagulated. Abdominal ultrasound February 2019 showed no aneurysm.  He was last seen by Dr. Stanford Breed 07/20/2021 and was doing well at that time.  He presented to his PCP on 01/01/2022 with concerns that his Apple Watch was showing A-fib.  He also reported some dizziness.  BP at that time was controlled.  Since that time he called our office stating he had elevated blood pressure readings and requested an appointment.  He presents today for scheduled follow-up of uncontrolled BP.   Hypertension Maintained on 10 mg lisinopril and 50 mg atenolol   Nonobstructive CAD Hyperlipidemia with LDL goal < 70 07/04/2021: Cholesterol 120; HDL 42; LDL Cholesterol (Calc) 60; Triglycerides 92 Continue 40 mg crestor, ASA, atenolol     Past Medical History:  Diagnosis Date   CAD (coronary artery disease)    Colon polyps    Diverticulitis    GERD (gastroesophageal reflux disease)    High cholesterol    Hypertension     Past Surgical History:  Procedure Laterality Date    ABDOMINAL PERINEAL BOWEL RESECTION     x 2   HERNIA REPAIR     x 6   KNEE ARTHROSCOPY Bilateral    TONSILLECTOMY      Current Medications: No outpatient medications have been marked as taking for the 02/12/22 encounter (Appointment) with Ledora Bottcher, [place].     Allergies:   Penicillins and Shellfish allergy   Social History   Socioeconomic History   Marital status: Married    Spouse name: Arihaan Bellucci   Number of children: 2   Years of education: 16   Highest education level: Bachelor's degree (e.g., BA, AB, BS)  Occupational History   Occupation: Self Employed   Occupation: Retired  Tobacco Use   Smoking status: Never   Smokeless tobacco: Never  Vaping Use   Vaping Use: Never used  Substance and Sexual Activity   Alcohol use: Yes    Alcohol/week: 5.0 standard drinks    Types: 5 Standard drinks or equivalent per week   Drug use: Never   Sexual activity: Yes    Partners: Female  Other Topics Concern   Not on file  Social History Narrative   Lives with his wife. He enjoys photography, music and volunteering.   Social Determinants of Health   Financial Resource Strain: Low Risk    Difficulty of Paying Living Expenses: Not hard at all  Food Insecurity: No Food Insecurity   Worried About  Running Out of Food in the Last Year: Never true   Ran Out of Food in the Last Year: Never true  Transportation Needs: No Transportation Needs   Lack of Transportation (Medical): No   Lack of Transportation (Non-Medical): No  Physical Activity: Insufficiently Active   Days of Exercise per Week: 2 days   Minutes of Exercise per Session: 60 min  Stress: No Stress Concern Present   Feeling of Stress : Not at all  Social Connections: Socially Integrated   Frequency of Communication with Friends and Family: Once a week   Frequency of Social Gatherings with Friends and Family: Three times a week   Attends Religious Services: More than 4 times per year   Active Member of  Clubs or Organizations: Yes   Attends Music therapist: More than 4 times per year   Marital Status: Married     Family History: The patient's ***family history includes Hypertension in his father; Prostate cancer in his father.  ROS:   Please see the history of present illness.    *** All other systems reviewed and are negative.  EKGs/Labs/Other Studies Reviewed:    The following studies were reviewed today: ***  EKG:  EKG is *** ordered today.  The ekg ordered today demonstrates ***  Recent Labs: 07/04/2021: ALT 25; Hemoglobin 14.7; Platelets 192 08/06/2021: BUN 13; Creat 0.76; Potassium 4.4; Sodium 143  Recent Lipid Panel    Component Value Date/Time   CHOL 120 07/04/2021 0805   TRIG 92 07/04/2021 0805   HDL 42 07/04/2021 0805   CHOLHDL 2.9 07/04/2021 0805   LDLCALC 60 07/04/2021 0805   LDLDIRECT 71 07/06/2020 0941     Risk Assessment/Calculations:   {Does this patient have ATRIAL FIBRILLATION?[phone]}       Physical Exam:    VS:  There were no vitals taken for this visit.    Wt Readings from Last 3 Encounters:  01/01/22 277 lb (125.6 kg)  09/13/21 285 lb (129.3 kg)  07/24/21 270 lb 1.9 oz (122.5 kg)     GEN: *** Well nourished, well developed in no acute distress HEENT: Normal NECK: No JVD; No carotid bruits LYMPHATICS: No lymphadenopathy CARDIAC: ***RRR, no murmurs, rubs, gallops RESPIRATORY:  Clear to auscultation without rales, wheezing or rhonchi  ABDOMEN: Soft, non-tender, non-distended MUSCULOSKELETAL:  No edema; No deformity  SKIN: Warm and dry NEUROLOGIC:  Alert and oriented x 3 PSYCHIATRIC:  Normal affect   ASSESSMENT:    No diagnosis found. PLAN:    In order of problems listed above:  ***      {Are you ordering a CV Procedure (e.g. stress test, cath, DCCV, TEE, etc)?   Press F2        :[id number]}    Medication Adjustments/Labs and Tests Ordered: Current medicines are reviewed at length with the patient today.   Concerns regarding medicines are outlined above.  No orders of the defined types were placed in this encounter.  No orders of the defined types were placed in this encounter.   There are no Patient Instructions on file for this visit.   Signed, Ledora Bottcher, [place]  02/09/2022 7:17 PM    [facility]

## 2022-02-12 ENCOUNTER — Ambulatory Visit (INDEPENDENT_AMBULATORY_CARE_PROVIDER_SITE_OTHER): Payer: Medicare Other | Admitting: Physician Assistant

## 2022-02-12 ENCOUNTER — Encounter: Payer: Self-pay | Admitting: Physician Assistant

## 2022-02-12 ENCOUNTER — Other Ambulatory Visit: Payer: Self-pay

## 2022-02-12 ENCOUNTER — Ambulatory Visit (INDEPENDENT_AMBULATORY_CARE_PROVIDER_SITE_OTHER): Payer: Medicare Other

## 2022-02-12 VITALS — BP 142/80 | HR 60 | Ht 74.0 in | Wt 280.6 lb

## 2022-02-12 DIAGNOSIS — R002 Palpitations: Secondary | ICD-10-CM

## 2022-02-12 DIAGNOSIS — I499 Cardiac arrhythmia, unspecified: Secondary | ICD-10-CM

## 2022-02-12 DIAGNOSIS — I2510 Atherosclerotic heart disease of native coronary artery without angina pectoris: Secondary | ICD-10-CM | POA: Diagnosis not present

## 2022-02-12 DIAGNOSIS — R42 Dizziness and giddiness: Secondary | ICD-10-CM | POA: Diagnosis not present

## 2022-02-12 DIAGNOSIS — E7800 Pure hypercholesterolemia, unspecified: Secondary | ICD-10-CM | POA: Diagnosis not present

## 2022-02-12 DIAGNOSIS — I10 Essential (primary) hypertension: Secondary | ICD-10-CM | POA: Diagnosis not present

## 2022-02-12 NOTE — Patient Instructions (Addendum)
Medication Instructions:  ?The current medical regimen is effective;  continue present plan and medications. ? ?*If you need a refill on your cardiac medications before your next appointment, please call your pharmacy* ? ? ?Lab Work: ?None ordered ? ?Testing/Procedures: ?ZIO XT- Long Term Monitor Instructions ? ?Your physician has requested you wear a ZIO patch monitor for 14 days.  ?This is a single patch monitor. [employer] supplies one patch monitor per enrollment. Additional ?stickers are not available. Please do not apply patch if you will be having a Nuclear Stress Test,  ?Echocardiogram, Cardiac CT, MRI, or Chest Xray during the period you would be wearing the  ?monitor. The patch cannot be worn during these tests. You cannot remove and re-apply the  ?ZIO XT patch monitor.  ?Your ZIO patch monitor will be mailed 3 day USPS to your address on file. It may take 3-5 days  ?to receive your monitor after you have been enrolled.  ?Once you have received your monitor, please review the enclosed instructions. Your monitor  ?has already been registered assigning a specific monitor serial # to you. ? ?Billing and Patient Assistance Program Information ? ?We have supplied [employer] with any of your insurance information on file for billing purposes. ?[employer] offers a sliding scale Patient Assistance Program for patients that do not have  ?insurance, or whose insurance does not completely cover the cost of the ZIO monitor.  ?You must apply for the Patient Assistance Program to qualify for this discounted rate.  ?To apply, please call [employer] at [phone], select option 4, select option 2, ask to apply for  ?Patient Assistance Program. Theodore Demark will ask your household income, and how many people  ?are in your household. They will quote your out-of-pocket cost based on that information.  ?[employer] will also be able to set up a 73-month interest-free payment plan if needed. ? ?Applying the monitor ?  ?Shave hair from upper  left chest.  ?Hold abrader disc by orange tab. Rub abrader in 40 strokes over the upper left chest as  ?indicated in your monitor instructions.  ?Clean area with 4 enclosed alcohol pads. Let dry.  ?Apply patch as indicated in monitor instructions. Patch will be placed under collarbone on left  ?side of chest with arrow pointing upward.  ?Rub patch adhesive wings for 2 minutes. Remove white label marked "1". Remove the white  ?label marked "2". Rub patch adhesive wings for 2 additional minutes.  ?While looking in a mirror, press and release button in center of patch. A small green light will  ?flash 3-4 times. This will be your only indicator that the monitor has been turned on.  ?Do not shower for the first 24 hours. You may shower after the first 24 hours.  ?Press the button if you feel a symptom. You will hear a small click. Record Date, Time and  ?Symptom in the Patient Logbook.  ?When you are ready to remove the patch, follow instructions on the last 2 pages of Patient  ?Logbook. Stick patch monitor onto the last page of Patient Logbook.  ?Place Patient Logbook in the blue and white box. Use locking tab on box and tape box closed  ?securely. The blue and white box has prepaid postage on it. Please place it in the mailbox as  ?soon as possible. Your physician should have your test results approximately 7 days after the  ?[facility]  ?Call [facility] [phone] you have questions  regarding  ?your ZIO XT patch monitor. Call them immediately if you see an orange light blinking on your  ?monitor.  ?If your monitor falls off in less than 4 days, contact our Monitor department at [phone].  ?If your monitor becomes loose or falls off after 4 days call [employer] at [phone] for  ?suggestions on securing your monitor ? ? ? ?Follow-Up: ?At [facility], you and your health needs are our priority.  As part of our continuing mission to provide you  with exceptional heart care, we have created designated Provider Care Teams.  These Care Teams include your primary Cardiologist (physician) and Advanced Practice Providers (APPs -  Physician Assistants and Nurse Practitioners) who all work together to provide you with the care you need, when you need it. ? ?We recommend signing up for the patient portal called "MyChart".  Sign up information is provided on this After Visit Summary.  MyChart is used to connect with patients for Virtual Visits (Telemedicine).  Patients are able to view lab/test results, encounter notes, upcoming appointments, etc.  Non-urgent messages can be sent to your provider as well.   ?To learn more about what you can do with MyChart, go to [email or web address].   ? ?Your next appointment:   ?6 week(s) ? ?The format for your next appointment:   ?In Person ? ?Provider:   ?Kirk Ruths, MD  ? ?ENT referral to Radene Journey, MD ? ?

## 2022-02-12 NOTE — Progress Notes (Unsigned)
Enrolled for [employer] to mail a ZIO XT long term holter monitor to the patients address on file.  

## 2022-02-15 DIAGNOSIS — R002 Palpitations: Secondary | ICD-10-CM

## 2022-02-18 NOTE — Addendum Note (Signed)
Addended by: Lubertha Sayres on: 02/18/2022 01:39 PM ? ? Modules accepted: Orders ? ?

## 2022-03-05 DIAGNOSIS — R002 Palpitations: Secondary | ICD-10-CM | POA: Diagnosis not present

## 2022-03-10 ENCOUNTER — Telehealth: Payer: Self-pay | Admitting: *Deleted

## 2022-03-10 DIAGNOSIS — I480 Paroxysmal atrial fibrillation: Secondary | ICD-10-CM

## 2022-03-10 MED ORDER — APIXABAN 5 MG PO TABS: 5.0000 mg | ORAL_TABLET | Freq: Two times a day (BID) | ORAL | 6 refills | Status: DC

## 2022-03-10 NOTE — Telephone Encounter (Signed)
Spoke with pt, Aware of dr Jacalyn Lefevre recommendations.  ?New script sent to the pharmacy  ?Patient will go to [facility] this week for lab work ?He will keep his follow up appointment. ?

## 2022-03-10 NOTE — Telephone Encounter (Signed)
-----   Message from Charlie Pitter, [place] sent at 03/10/2022  4:30 PM EDT ----- ?Covering Angie [facility]'s inbox. Please let pt know his monitor result did show some incidental skipped beats and confirmation of intermittent atrial fibrillation. ? ?Plan: ?- I reviewed with Dr. Stanford Breed who recommended to STOP aspirin and START Eliquis '5mg'$  BID. Please let patient know that when patients have atrial fibrillation they can be 5 times more likely to have a stroke and require a specific type of blood thinner to help prevent blood clots from forming in the heart. ?- The main risk of a blood thinner is bleeding, so we recommend all patients watch for bleeding and call their doctor if any blood in stool, black tarry stools, blood in urine, nosebleeds or any other unusual bleeding. It's not normal to have this kind of bleeding even if you're on a blood thinner and usually indicates there is an underlying problem with one of your body systems that needs to be checked out.  ?- I would also recommend we update his CBC, BMET, Mg, TSH in a lab visit at the office this week if he's willing ?- Keep f/u 03/25/22 with Dr. Stanford Breed ?

## 2022-03-11 DIAGNOSIS — I480 Paroxysmal atrial fibrillation: Secondary | ICD-10-CM | POA: Diagnosis not present

## 2022-03-12 ENCOUNTER — Encounter: Payer: Self-pay | Admitting: Cardiology

## 2022-03-12 LAB — BASIC METABOLIC PANEL
BUN/Creatinine Ratio: 17 (ref 10–24)
BUN: 14 mg/dL (ref 8–27)
CO2: 27 mmol/L (ref 20–29)
Calcium: 9 mg/dL (ref 8.6–10.2)
Chloride: 105 mmol/L (ref 96–106)
Creatinine, Ser: 0.82 mg/dL (ref 0.76–1.27)
Glucose: 119 mg/dL — ABNORMAL HIGH (ref 70–99)
Potassium: 4.7 mmol/L (ref 3.5–5.2)
Sodium: 141 mmol/L (ref 134–144)
eGFR: 93 mL/min/{1.73_m2} (ref >59)

## 2022-03-12 LAB — CBC
Hematocrit: 43.7 % (ref 37.5–51.0)
Hemoglobin: 14.4 g/dL (ref 13.0–17.7)
MCH: 29.2 pg (ref 26.6–33.0)
MCHC: 33 g/dL (ref 31.5–35.7)
MCV: 89 fL (ref 79–97)
Platelets: 186 10*3/uL (ref 150–450)
RBC: 4.93 x10E6/uL (ref 4.14–5.80)
RDW: 13.3 % (ref 11.6–15.4)
WBC: 5.9 10*3/uL (ref 3.4–10.8)

## 2022-03-12 LAB — MAGNESIUM: Magnesium: 2.1 mg/dL (ref 1.6–2.3)

## 2022-03-12 LAB — TSH: TSH: 1.03 u[IU]/mL (ref 0.450–4.500)

## 2022-03-12 NOTE — Telephone Encounter (Signed)
-  Pt called stating he has a lot of questioning regarding his monitor results prior to starting Eliquis and would like to talk with Dr. Stanford Breed ?-Pt has an appointment scheduled for 4/25. ?-Nurse attempted to offer a soon appointment with PA or NP, but pt state he would prefer to discuss with Dr. Stanford Breed only. Pt stated, " I have survived this long, I can wait another two weeks." ?-Nurse again reiterated would could see him sooner but pt declined.  ?

## 2022-03-17 ENCOUNTER — Encounter: Payer: Self-pay | Admitting: Family Medicine

## 2022-03-18 ENCOUNTER — Telehealth: Payer: Self-pay | Admitting: Cardiology

## 2022-03-18 NOTE — Telephone Encounter (Addendum)
Eliquis ?Indication: Afib  ?Last office visit: 02/12/22 ([facility])  ?Scr: 0.82 (03/11/22)  ?Age: 71 ?Weight: 127.3kg ? ?Called pt and verified correct dose of Eliquis '5mg'$  two times daily based on dosing criteria (age, weight, and Scr). Pt verbalized understanding; however he stated he still was NOT going to start taking the medication until he saw Dr Stanford Breed on 03/25/22. Educated the pt of the importance of this medication and how patients with atrial fibrillation are at risk to have a stroke and require a specific type of blood thinner to help prevent blood clots from forming in the heart. Pt stated he understood the risk but still did not want to start until upcoming appt.  ? ? ? ? ?

## 2022-03-18 NOTE — Telephone Encounter (Signed)
Pt c/o medication issue: ? ?1. Name of Medication:  ? apixaban (ELIQUIS) 5 MG TABS tablet  ? ? ?2. How are you currently taking this medication (dosage and times per day)? Take 1 tablet (5 mg total) by mouth 2 (two) times daily. ? ?3. Are you having a reaction (difficulty breathing--STAT)? No ? ?4. What is your medication issue? Pt states that he needs clarification on dosage and instructions for medication. Pt states he was told to take 2.'5mg'$  tablet. Please advise ? ? ?

## 2022-03-19 NOTE — Progress Notes (Signed)
? ? ? ? ?HPI: FU CAD. Echocardiogram 2017 showed normal LV function and mild left ventricular hypertrophy.  Abdominal ultrasound February 2019 showed no aneurysm.  Cardiac catheterization March 2019 showed less than 50% involving the mid right coronary artery and proximal LAD.  Monitor April 2023 showed sinus rhythm with paroxysmal atrial fibrillation, PVCs and 6 beats nonsustained ventricular tachycardia.  Since last seen, he denies dyspnea, chest pain, palpitations or syncope.  He has no symptoms with his atrial fibrillation. ? ?Current Outpatient Medications  ?Medication Sig Dispense Refill  ? AMBULATORY NON FORMULARY MEDICATION Please provide autopap machine with humidifier.  Pressure 6-16 cmH20.  Please provide supplies including mask per patient choice, filters, heated and humidified tubing.  Diagnosis: OSA G47.33 1 Device 0  ? atenolol (TENORMIN) 50 MG tablet Take 1 tablet (50 mg total) by mouth daily. 5 tablet 0  ? esomeprazole (NEXIUM) 20 MG packet     ? fluticasone-salmeterol (WIXELA INHUB) 250-50 MCG/ACT AEPB Inhale 1 puff into the lungs in the morning and at bedtime. 60 each 3  ? furosemide (LASIX) 40 MG tablet Take 40 mg by mouth. As needed.    ? HYDROXYZINE PAMOATE PO     ? lisinopril (ZESTRIL) 10 MG tablet Take 1 tablet (10 mg total) by mouth daily. 90 tablet 3  ? Multiple Vitamin (MULTIVITAMIN) capsule Take 1 capsule by mouth daily.    ? Respiratory Therapy Supplies DEVI by Does not apply route.    ? rosuvastatin (CRESTOR) 40 MG tablet Take 1 tablet (40 mg total) by mouth daily. 90 tablet 3  ? tadalafil (CIALIS) 5 MG tablet Take 1-2 tablets (5-10 mg total) by mouth daily as needed for erectile dysfunction. Using good Rx card 30 tablet 3  ? terbinafine (LAMISIL) 250 MG tablet Take 250 mg by mouth daily.    ? zolpidem (AMBIEN) 5 MG tablet Take 1 tablet (5 mg total) by mouth at bedtime. 90 tablet 1  ? apixaban (ELIQUIS) 5 MG TABS tablet Take 1 tablet (5 mg total) by mouth 2 (two) times daily. (Patient  not taking: Reported on 03/25/2022) 60 tablet 6  ? ?No current facility-administered medications for this visit.  ? ? ? ?Past Medical History:  ?Diagnosis Date  ? CAD (coronary artery disease)   ? Colon polyps   ? Diverticulitis   ? GERD (gastroesophageal reflux disease)   ? High cholesterol   ? Hypertension   ? ? ?Past Surgical History:  ?Procedure Laterality Date  ? ABDOMINAL PERINEAL BOWEL RESECTION    ? x 2  ? HERNIA REPAIR    ? x 6  ? KNEE ARTHROSCOPY Bilateral   ? TONSILLECTOMY    ? ? ?Social History  ? ?Socioeconomic History  ? Marital status: Married  ?  Spouse name: Sevin Langenbach  ? Number of children: 2  ? Years of education: 61  ? Highest education level: Bachelor's degree (e.g., BA, AB, BS)  ?Occupational History  ? Occupation: Self Employed  ? Occupation: Retired  ?Tobacco Use  ? Smoking status: Never  ? Smokeless tobacco: Never  ?Vaping Use  ? Vaping Use: Never used  ?Substance and Sexual Activity  ? Alcohol use: Yes  ?  Alcohol/week: 5.0 standard drinks  ?  Types: 5 Standard drinks or equivalent per week  ? Drug use: Never  ? Sexual activity: Yes  ?  Partners: Female  ?Other Topics Concern  ? Not on file  ?Social History Narrative  ? Lives with his wife. He enjoys photography,  music and volunteering.  ? ?Social Determinants of Health  ? ?Financial Resource Strain: Low Risk   ? Difficulty of Paying Living Expenses: Not hard at all  ?Food Insecurity: No Food Insecurity  ? Worried About Charity fundraiser in the Last Year: Never true  ? Ran Out of Food in the Last Year: Never true  ?Transportation Needs: No Transportation Needs  ? Lack of Transportation (Medical): No  ? Lack of Transportation (Non-Medical): No  ?Physical Activity: Insufficiently Active  ? Days of Exercise per Week: 2 days  ? Minutes of Exercise per Session: 60 min  ?Stress: No Stress Concern Present  ? Feeling of Stress : Not at all  ?Social Connections: Socially Integrated  ? Frequency of Communication with Friends and Family: Once  a week  ? Frequency of Social Gatherings with Friends and Family: Three times a week  ? Attends Religious Services: More than 4 times per year  ? Active Member of Clubs or Organizations: Yes  ? Attends Archivist Meetings: More than 4 times per year  ? Marital Status: Married  ?Intimate Partner Violence: Not At Risk  ? Fear of Current or Ex-Partner: No  ? Emotionally Abused: No  ? Physically Abused: No  ? Sexually Abused: No  ? ? ?Family History  ?Problem Relation Age of Onset  ? Hypertension Father   ? Prostate cancer Father   ? ? ?ROS: no fevers or chills, productive cough, hemoptysis, dysphasia, odynophagia, melena, hematochezia, dysuria, hematuria, rash, seizure activity, orthopnea, PND, pedal edema, claudication. Remaining systems are negative. ? ?Physical Exam: ?Well-developed well-nourished in no acute distress.  ?Skin is warm and dry.  ?HEENT is normal.  ?Neck is supple.  ?Chest is clear to auscultation with normal expansion.  ?Cardiovascular exam is regular rate and rhythm.  ?Abdominal exam nontender or distended. No masses palpated. ?Extremities show no edema. ?neuro grossly intact ? ?ECG-sinus bradycardia at a rate of 59, no ST changes.  Personally reviewed ? ?A/P ? ?1 paroxysmal atrial fibrillation-patient is in sinus rhythm on examination today.  He is asymptomatic with his atrial fibrillation.  We will continue atenolol for rate control if atrial fibrillation recurs.  Embolic risk factors include age greater than 50, coronary artery disease and hypertension.  He will check on the cost of apixaban and Xarelto and will initiate whichever 1 is the least expensive. ? ?2 hypertension-patient's blood pressure is controlled.  Continue present medications and follow. ? ?3 coronary artery disease-patient is without chest pain.  Continue medical therapy with statin.  We will discontinue aspirin given need for apixaban. ? ?4 hyperlipidemia-continue statin. ? ?Kirk Ruths, MD ? ? ? ?

## 2022-03-25 ENCOUNTER — Ambulatory Visit (INDEPENDENT_AMBULATORY_CARE_PROVIDER_SITE_OTHER): Payer: Medicare Other | Admitting: Cardiology

## 2022-03-25 ENCOUNTER — Encounter: Payer: Self-pay | Admitting: Cardiology

## 2022-03-25 VITALS — BP 140/69 | HR 59 | Ht 74.0 in | Wt 269.0 lb

## 2022-03-25 DIAGNOSIS — I2510 Atherosclerotic heart disease of native coronary artery without angina pectoris: Secondary | ICD-10-CM | POA: Diagnosis not present

## 2022-03-25 DIAGNOSIS — E7800 Pure hypercholesterolemia, unspecified: Secondary | ICD-10-CM

## 2022-03-25 DIAGNOSIS — I10 Essential (primary) hypertension: Secondary | ICD-10-CM | POA: Diagnosis not present

## 2022-03-25 DIAGNOSIS — I480 Paroxysmal atrial fibrillation: Secondary | ICD-10-CM | POA: Diagnosis not present

## 2022-03-25 MED ORDER — APIXABAN 5 MG PO TABS: 5.0000 mg | ORAL_TABLET | Freq: Two times a day (BID) | ORAL | 3 refills | Status: DC

## 2022-03-25 NOTE — Patient Instructions (Signed)
Medication Instructions:  ? ?XARELTO SAME AS ELIQUIS ? ?*If you need a refill on your cardiac medications before your next appointment, please call your pharmacy* ? ? ?Follow-Up: ?At [facility], you and your health needs are our priority.  As part of our continuing mission to provide you with exceptional heart care, we have created designated Provider Care Teams.  These Care Teams include your primary Cardiologist (physician) and Advanced Practice Providers (APPs -  Physician Assistants and Nurse Practitioners) who all work together to provide you with the care you need, when you need it. ? ?We recommend signing up for the patient portal called "MyChart".  Sign up information is provided on this After Visit Summary.  MyChart is used to connect with patients for Virtual Visits (Telemedicine).  Patients are able to view lab/test results, encounter notes, upcoming appointments, etc.  Non-urgent messages can be sent to your provider as well.   ?To learn more about what you can do with MyChart, go to [email or web address].   ? ?Your next appointment:   ?6 month(s) ? ?The format for your next appointment:   ?In Person ? ?Provider:   ?Kirk Ruths, MD   ? ? ? ?Important Information About Sugar ? ? ? ? ?  ?

## 2022-03-27 ENCOUNTER — Encounter: Payer: Self-pay | Admitting: Family Medicine

## 2022-03-27 MED ORDER — ZOLPIDEM TARTRATE 5 MG PO TABS: 5.0000 mg | ORAL_TABLET | Freq: Every day | ORAL | 1 refills | Status: DC

## 2022-03-27 NOTE — Telephone Encounter (Signed)
Last written 10/16/2021 #90 with 1 refill ?Last appt 01/01/2022 ? ?Sign if appropriate ?

## 2022-03-28 ENCOUNTER — Other Ambulatory Visit: Payer: Self-pay

## 2022-04-02 ENCOUNTER — Encounter: Payer: Self-pay | Admitting: Cardiology

## 2022-05-01 ENCOUNTER — Encounter: Payer: Self-pay | Admitting: Family Medicine

## 2022-05-01 MED ORDER — HYDROXYZINE PAMOATE 25 MG PO CAPS: 25.0000 mg | ORAL_CAPSULE | Freq: Two times a day (BID) | ORAL | 0 refills | Status: DC

## 2022-05-01 NOTE — Telephone Encounter (Signed)
We have not prescribed these medications for the patient previously.  Please review and refill if appropriate.  T. Carlyle Mcelrath, CMA  

## 2022-05-29 DIAGNOSIS — Z8719 Personal history of other diseases of the digestive system: Secondary | ICD-10-CM | POA: Diagnosis not present

## 2022-05-29 DIAGNOSIS — K529 Noninfective gastroenteritis and colitis, unspecified: Secondary | ICD-10-CM | POA: Diagnosis not present

## 2022-05-29 DIAGNOSIS — Z9889 Other specified postprocedural states: Secondary | ICD-10-CM | POA: Diagnosis not present

## 2022-06-30 MED ORDER — ZOLPIDEM TARTRATE 5 MG PO TABS: 5.0000 mg | ORAL_TABLET | Freq: Every day | ORAL | 1 refills | Status: DC

## 2022-06-30 NOTE — Addendum Note (Signed)
Addended by: Perlie Mayo on: 06/30/2022 12:10 PM   Modules accepted: Orders

## 2022-06-30 NOTE — Addendum Note (Signed)
Addended by: Narda Rutherford on: 06/30/2022 11:59 AM   Modules accepted: Orders

## 2022-07-01 ENCOUNTER — Ambulatory Visit: Payer: Medicare Other | Admitting: Family Medicine

## 2022-07-01 DIAGNOSIS — Z20822 Contact with and (suspected) exposure to covid-19: Secondary | ICD-10-CM | POA: Diagnosis not present

## 2022-07-01 DIAGNOSIS — E785 Hyperlipidemia, unspecified: Secondary | ICD-10-CM | POA: Diagnosis not present

## 2022-07-01 DIAGNOSIS — R110 Nausea: Secondary | ICD-10-CM | POA: Diagnosis not present

## 2022-07-01 DIAGNOSIS — K219 Gastro-esophageal reflux disease without esophagitis: Secondary | ICD-10-CM | POA: Diagnosis not present

## 2022-07-01 DIAGNOSIS — I10 Essential (primary) hypertension: Secondary | ICD-10-CM | POA: Diagnosis not present

## 2022-07-01 DIAGNOSIS — R5383 Other fatigue: Secondary | ICD-10-CM | POA: Diagnosis not present

## 2022-07-01 DIAGNOSIS — Z7982 Long term (current) use of aspirin: Secondary | ICD-10-CM | POA: Diagnosis not present

## 2022-07-01 DIAGNOSIS — R109 Unspecified abdominal pain: Secondary | ICD-10-CM | POA: Diagnosis not present

## 2022-07-01 DIAGNOSIS — Z87442 Personal history of urinary calculi: Secondary | ICD-10-CM

## 2022-07-01 DIAGNOSIS — N23 Unspecified renal colic: Secondary | ICD-10-CM | POA: Diagnosis not present

## 2022-07-01 HISTORY — DX: Personal history of urinary calculi: Z87.442

## 2022-07-02 ENCOUNTER — Encounter: Payer: Self-pay | Admitting: Family Medicine

## 2022-07-04 ENCOUNTER — Encounter: Payer: Self-pay | Admitting: Family Medicine

## 2022-07-04 DIAGNOSIS — N200 Calculus of kidney: Secondary | ICD-10-CM

## 2022-07-10 DIAGNOSIS — H903 Sensorineural hearing loss, bilateral: Secondary | ICD-10-CM | POA: Diagnosis not present

## 2022-07-10 DIAGNOSIS — K219 Gastro-esophageal reflux disease without esophagitis: Secondary | ICD-10-CM | POA: Diagnosis not present

## 2022-07-18 DIAGNOSIS — N201 Calculus of ureter: Secondary | ICD-10-CM | POA: Diagnosis not present

## 2022-07-22 ENCOUNTER — Encounter: Payer: Self-pay | Admitting: Family Medicine

## 2022-07-22 ENCOUNTER — Ambulatory Visit (INDEPENDENT_AMBULATORY_CARE_PROVIDER_SITE_OTHER): Payer: Medicare Other | Admitting: Family Medicine

## 2022-07-22 DIAGNOSIS — I2510 Atherosclerotic heart disease of native coronary artery without angina pectoris: Secondary | ICD-10-CM

## 2022-07-22 DIAGNOSIS — I480 Paroxysmal atrial fibrillation: Secondary | ICD-10-CM | POA: Insufficient documentation

## 2022-07-22 DIAGNOSIS — I10 Essential (primary) hypertension: Secondary | ICD-10-CM

## 2022-07-22 DIAGNOSIS — F5101 Primary insomnia: Secondary | ICD-10-CM

## 2022-07-22 DIAGNOSIS — E7800 Pure hypercholesterolemia, unspecified: Secondary | ICD-10-CM

## 2022-07-22 MED ORDER — HYDROXYZINE PAMOATE 25 MG PO CAPS: 25.0000 mg | ORAL_CAPSULE | Freq: Two times a day (BID) | ORAL | 3 refills | Status: DC

## 2022-07-22 MED ORDER — LISINOPRIL 10 MG PO TABS: 10.0000 mg | ORAL_TABLET | Freq: Every day | ORAL | 3 refills | Status: DC

## 2022-07-22 MED ORDER — ROSUVASTATIN CALCIUM 40 MG PO TABS: 40.0000 mg | ORAL_TABLET | Freq: Every day | ORAL | 3 refills | Status: DC

## 2022-07-22 MED ORDER — FLUTICASONE-SALMETEROL 250-50 MCG/ACT IN AEPB: 1.0000 | INHALATION_SPRAY | Freq: Two times a day (BID) | RESPIRATORY_TRACT | 3 refills | Status: AC

## 2022-07-22 MED ORDER — ATENOLOL 50 MG PO TABS: 50.0000 mg | ORAL_TABLET | Freq: Every day | ORAL | 0 refills | Status: DC

## 2022-07-22 NOTE — Assessment & Plan Note (Signed)
Remains well controlled with Ambien as needed.  Discussed trying to limit this is much as possible.

## 2022-07-22 NOTE — Assessment & Plan Note (Signed)
Blood pressure is well controlled at this time.  Recommend continuation current medications for management of hypertension.

## 2022-07-22 NOTE — Progress Notes (Signed)
Terry Acosta - 71 y.o. male MRN [id number]  Date of birth: 10/30/1951  Subjective Chief Complaint  Patient presents with   Hypertension    HPI Terry Acosta is a 71 year old male here today for follow-up visit.  Reports he is doing well at this time.  Denies any concerns today.  For management of paroxysmal atrial fibrillation.  He does continue to see cardiology regularly.  Remains rate controlled with atenolol.  Anticoagulated with apixaban.  Additionally he is on lisinopril for management of his hypertension.  Tolerating Crestor well for management of hyperlipidemia.  Doing well with low-dose Ambien for chronic insomnia.  Does not require this every night.  ROS:  A comprehensive ROS was completed and negative except as noted per HPI  Allergies  Allergen Reactions   Penicillins Other (See Comments)    Syncope   Shellfish Allergy     Past Medical History:  Diagnosis Date   CAD (coronary artery disease)    Colon polyps    Diverticulitis    GERD (gastroesophageal reflux disease)    High cholesterol    History of kidney stones 07/01/2022   3 mm   Hypertension     Past Surgical History:  Procedure Laterality Date   ABDOMINAL PERINEAL BOWEL RESECTION     x 2   HERNIA REPAIR     x 6   KNEE ARTHROSCOPY Bilateral    TONSILLECTOMY      Social History   Socioeconomic History   Marital status: Married    Spouse name: Terry Acosta   Number of children: 2   Years of education: 16   Highest education level: Bachelor's degree (e.g., BA, AB, BS)  Occupational History   Occupation: Self Employed   Occupation: Retired  Tobacco Use   Smoking status: Never   Smokeless tobacco: Never  Vaping Use   Vaping Use: Never used  Substance and Sexual Activity   Alcohol use: Yes    Alcohol/week: 5.0 standard drinks of alcohol    Types: 5 Standard drinks or equivalent per week   Drug use: Never   Sexual activity: Yes    Partners: Female  Other Topics Concern   Not on file   Social History Narrative   Lives with his wife. He enjoys photography, music and volunteering.   Social Determinants of Health   Financial Resource Strain: Low Risk  (05/29/2021)   Overall Financial Resource Strain (CARDIA)    Difficulty of Paying Living Expenses: Not hard at all  Food Insecurity: No Food Insecurity (05/29/2021)   Hunger Vital Sign    Worried About Running Out of Food in the Last Year: Never true    Ran Out of Food in the Last Year: Never true  Transportation Needs: No Transportation Needs (05/29/2021)   PRAPARE - Hydrologist (Medical): No    Lack of Transportation (Non-Medical): No  Physical Activity: Insufficiently Active (05/29/2021)   Exercise Vital Sign    Days of Exercise per Week: 2 days    Minutes of Exercise per Session: 60 min  Stress: No Stress Concern Present (05/29/2021)   [place]    Feeling of Stress : Not at all  Social Connections: Socially Integrated (05/29/2021)   Social Connection and Isolation Panel [NHANES]    Frequency of Communication with Friends and Family: Once a week    Frequency of Social Gatherings with Friends and Family: Three times a week    Attends Religious Services:  More than 4 times per year    Active Member of Clubs or Organizations: Yes    Attends Archivist Meetings: More than 4 times per year    Marital Status: Married    Family History  Problem Relation Age of Onset   Hypertension Father    Prostate cancer Father     Health Maintenance  Topic Date Due   Diabetic kidney evaluation - Urine ACR  Never done   Hepatitis C Screening  Never done   Pneumonia Vaccine 65+ Years old (2 - PPSV23 or PCV20) 12/15/2015   COVID-19 Vaccine (4 - Moderna risk series) 11/26/2020   INFLUENZA VACCINE  07/01/2022   Diabetic kidney evaluation - GFR measurement  03/12/2023   TETANUS/TDAP  12/16/2023   COLONOSCOPY (Pts 45-34yr  Insurance coverage will need to be confirmed)  07/30/2026   Zoster Vaccines- Shingrix  Completed   HPV VACCINES  Aged Out     ----------------------------------------------------------------------------------------------------------------------------------------------------------------------------------------------------------------- Physical Exam BP 129/73 (BP Location: Left Arm, Patient Position: Sitting, Cuff Size: Large)   Pulse 64   Temp 98.2 F (36.8 C) (Oral)   Ht '6\' 2"'$  (1.88 m)   Wt 266 lb 1.9 oz (120.7 kg)   SpO2 96%   BMI 34.17 kg/m   Physical Exam Constitutional:      Appearance: Normal appearance.  Eyes:     General: No scleral icterus. Cardiovascular:     Rate and Rhythm: Normal rate and regular rhythm.  Pulmonary:     Effort: Pulmonary effort is normal.     Breath sounds: Normal breath sounds.  Musculoskeletal:     Cervical back: Neck supple.  Neurological:     General: No focal deficit present.     Mental Status: He is alert.  Psychiatric:        Mood and Affect: Mood normal.        Behavior: Behavior normal.     ------------------------------------------------------------------------------------------------------------------------------------------------------------------------------------------------------------------- Assessment and Plan  Essential hypertension Blood pressure is well controlled at this time.  Recommend continuation current medications for management of hypertension.  [place]F [place]ox[place]mal atrial fibrillation) [place] He is not convinced that he has A-fib.  He is wearing a watch to monitor his heart rate.  He will plan to follow-up with cardiology.  I recommend he continue atenolol and anticoagulation with apixaban.  Insomnia Remains well controlled with Ambien as needed.  Discussed trying to limit this is much as possible.  Hyperlipidemia Lab Results  Component Value Date   LDLCALC 60 07/04/2021  Tolerating Crestor well.   Meds  ordered this encounter  Medications   fluticasone-salmeterol (WIXELA INHUB) 250-50 MCG/ACT AEPB    Sig: Inhale 1 puff into the lungs in the morning and at bedtime.    Dispense:  60 each    Refill:  3   atenolol (TENORMIN) 50 MG tablet    Sig: Take 1 tablet (50 mg total) by mouth daily.    Dispense:  5 tablet    Refill:  0   hydrOXYzine (VISTARIL) 25 MG capsule    Sig: Take 1 capsule (25 mg total) by mouth 2 (two) times daily.    Dispense:  180 capsule    Refill:  3   lisinopril (ZESTRIL) 10 MG tablet    Sig: Take 1 tablet (10 mg total) by mouth daily.    Dispense:  90 tablet    Refill:  3   rosuvastatin (CRESTOR) 40 MG tablet    Sig: Take 1 tablet (40 mg total) by mouth daily.  Dispense:  90 tablet    Refill:  3    Return in about 6 months (around 01/22/2023) for HTN?fasting labs.    This visit occurred during the SARS-CoV-2 public health emergency.  Safety protocols were in place, including screening questions prior to the visit, additional usage of staff PPE, and extensive cleaning of exam room while observing appropriate contact time as indicated for disinfecting solutions.

## 2022-07-22 NOTE — Assessment & Plan Note (Signed)
He is not convinced that he has A-fib.  He is wearing a watch to monitor his heart rate.  He will plan to follow-up with cardiology.  I recommend he continue atenolol and anticoagulation with apixaban.

## 2022-07-22 NOTE — Assessment & Plan Note (Signed)
Lab Results  Component Value Date   LDLCALC 60 07/04/2021  Tolerating Crestor well.

## 2022-07-23 ENCOUNTER — Encounter: Payer: Self-pay | Admitting: General Practice

## 2022-08-09 ENCOUNTER — Telehealth: Payer: Self-pay

## 2022-08-09 NOTE — Telephone Encounter (Signed)
Initiated Prior authorization UTM:[id number] 250-50MCG/ACT aerosol powder  Via: Covermymeds Case/Key: [id number] Status: approved  as of 08/09/22 Reason:Coverage Starts on: 12/01/2021 12:00:00 AM, Coverage Ends on: 11/30/2022 12:00:00 AM Notified Pt via: Mychart

## 2022-08-12 ENCOUNTER — Other Ambulatory Visit: Payer: Self-pay | Admitting: Cardiology

## 2022-08-12 ENCOUNTER — Other Ambulatory Visit: Payer: Self-pay | Admitting: Family Medicine

## 2022-08-12 DIAGNOSIS — I2510 Atherosclerotic heart disease of native coronary artery without angina pectoris: Secondary | ICD-10-CM

## 2022-08-21 ENCOUNTER — Other Ambulatory Visit: Payer: Self-pay | Admitting: Family Medicine

## 2022-08-21 MED ORDER — SILDENAFIL CITRATE 100 MG PO TABS: 50.0000 mg | ORAL_TABLET | Freq: Every day | ORAL | 5 refills | Status: DC | PRN

## 2022-09-05 ENCOUNTER — Telehealth: Payer: Self-pay | Admitting: Family Medicine

## 2022-09-05 NOTE — Telephone Encounter (Signed)
Called patient regarding AWV and left voice mail for them to schedule. Terry Acosta

## 2022-09-15 NOTE — Progress Notes (Signed)
HPI: FU CAD. Echocardiogram 2017 showed normal LV function and mild left ventricular hypertrophy.  Abdominal ultrasound February 2019 showed no aneurysm.  Cardiac catheterization March 2019 showed less than 50% involving the mid right coronary artery and proximal LAD.  Monitor April 2023 showed sinus rhythm with paroxysmal atrial fibrillation, PVCs and 6 beats nonsustained ventricular tachycardia.  Since last seen, patient has some dyspnea on exertion but no orthopnea, PND, chest pain or syncope.  Minimal pedal edema.  He is having some difficulties with erectile dysfunction.  Current Outpatient Medications  Medication Sig Dispense Refill   AMBULATORY NON FORMULARY MEDICATION Please provide autopap machine with humidifier.  Pressure 6-16 cmH20.  Please provide supplies including mask per patient choice, filters, heated and humidified tubing.  Diagnosis: OSA G47.33 1 Device 0   apixaban (ELIQUIS) 5 MG TABS tablet Take 1 tablet (5 mg total) by mouth 2 (two) times daily. 180 tablet 3   atenolol (TENORMIN) 50 MG tablet Take 1 tablet (50 mg total) by mouth daily. 5 tablet 0   esomeprazole (NEXIUM) 20 MG packet      fluticasone-salmeterol (WIXELA INHUB) 250-50 MCG/ACT AEPB Inhale 1 puff into the lungs in the morning and at bedtime. 60 each 3   furosemide (LASIX) 40 MG tablet Take 40 mg by mouth. As needed.     hydrOXYzine (VISTARIL) 25 MG capsule Take 1 capsule (25 mg total) by mouth 2 (two) times daily. 180 capsule 3   lisinopril (ZESTRIL) 10 MG tablet TAKE 1 TABLET EVERY DAY 90 tablet 3   Multiple Vitamin (MULTIVITAMIN) capsule Take 1 capsule by mouth daily.     Respiratory Therapy Supplies DEVI by Does not apply route.     rosuvastatin (CRESTOR) 40 MG tablet TAKE 1 TABLET EVERY DAY 90 tablet 3   terbinafine (LAMISIL) 1 % cream Apply 1 Application topically 2 (two) times daily.     terbinafine (LAMISIL) 250 MG tablet Take 250 mg by mouth daily.     zolpidem (AMBIEN) 5 MG tablet TAKE 1 TABLET AT  BEDTIME 90 tablet 1   No current facility-administered medications for this visit.     Past Medical History:  Diagnosis Date   CAD (coronary artery disease)    Colon polyps    Diverticulitis    GERD (gastroesophageal reflux disease)    High cholesterol    History of kidney stones 07/01/2022   3 mm   Hypertension     Past Surgical History:  Procedure Laterality Date   ABDOMINAL PERINEAL BOWEL RESECTION     x 2   HERNIA REPAIR     x 6   KNEE ARTHROSCOPY Bilateral    TONSILLECTOMY      Social History   Socioeconomic History   Marital status: Married    Spouse name: Tinsley Lomas   Number of children: 2   Years of education: 16   Highest education level: Bachelor's degree (e.g., BA, AB, BS)  Occupational History   Occupation: Self Employed   Occupation: Retired  Tobacco Use   Smoking status: Never   Smokeless tobacco: Never  Vaping Use   Vaping Use: Never used  Substance and Sexual Activity   Alcohol use: Yes    Alcohol/week: 5.0 standard drinks of alcohol    Types: 5 Standard drinks or equivalent per week   Drug use: Never   Sexual activity: Yes    Partners: Female  Other Topics Concern   Not on file  Social History Narrative   Lives  with his wife. He enjoys photography, music and volunteering.   Social Determinants of Health   Financial Resource Strain: Low Risk  (05/29/2021)   Overall Financial Resource Strain (CARDIA)    Difficulty of Paying Living Expenses: Not hard at all  Food Insecurity: No Food Insecurity (05/29/2021)   Hunger Vital Sign    Worried About Running Out of Food in the Last Year: Never true    Ran Out of Food in the Last Year: Never true  Transportation Needs: No Transportation Needs (05/29/2021)   PRAPARE - Hydrologist (Medical): No    Lack of Transportation (Non-Medical): No  Physical Activity: Insufficiently Active (05/29/2021)   Exercise Vital Sign    Days of Exercise per Week: 2 days    Minutes  of Exercise per Session: 60 min  Stress: No Stress Concern Present (05/29/2021)   [place]    Feeling of Stress : Not at all  Social Connections: Socially Integrated (05/29/2021)   Social Connection and Isolation Panel [NHANES]    Frequency of Communication with Friends and Family: Once a week    Frequency of Social Gatherings with Friends and Family: Three times a week    Attends Religious Services: More than 4 times per year    Active Member of Clubs or Organizations: Yes    Attends Archivist Meetings: More than 4 times per year    Marital Status: Married  Human resources officer Violence: Not At Risk (05/29/2021)   Humiliation, Afraid, Rape, and Kick questionnaire    Fear of Current or Ex-Partner: No    Emotionally Abused: No    Physically Abused: No    Sexually Abused: No    Family History  Problem Relation Age of Onset   Hypertension Father    Prostate cancer Father     ROS: no fevers or chills, productive cough, hemoptysis, dysphasia, odynophagia, melena, hematochezia, dysuria, hematuria, rash, seizure activity, orthopnea, PND, pedal edema, claudication. Remaining systems are negative.  Physical Exam: Well-developed well-nourished in no acute distress.  Skin is warm and dry.  HEENT is normal.  Neck is supple.  Chest is clear to auscultation with normal expansion.  Cardiovascular exam is regular rate and rhythm.  Abdominal exam nontender or distended. No masses palpated. Extremities show trace edema. neuro grossly intact  A/P  1 paroxysmal atrial fibrillation-patient remains in sinus rhythm.  Continue beta-blocker at present dose.  Continue anticoagulation with apixaban.  Check hemoglobin and renal function.  2 hypertension-patient's blood pressure is controlled today.  However he is complaining of erectile dysfunction.  I will decrease atenolol to 25 mg daily for 3 days then discontinue.  We will  then begin Cardizem CD 120 mg daily.  Follow blood pressure and adjust medications as needed.  3 hyperlipidemia-continue statin.  Check lipids and liver.  4 coronary artery disease-patient denies chest pain.  Continue statin.  He is not on aspirin given need for apixaban.  Kirk Ruths, MD

## 2022-09-16 ENCOUNTER — Ambulatory Visit: Payer: Medicare Other | Admitting: Cardiology

## 2022-09-24 DIAGNOSIS — Z23 Encounter for immunization: Secondary | ICD-10-CM | POA: Diagnosis not present

## 2022-09-25 ENCOUNTER — Other Ambulatory Visit: Payer: Self-pay | Admitting: Family Medicine

## 2022-09-29 ENCOUNTER — Ambulatory Visit (INDEPENDENT_AMBULATORY_CARE_PROVIDER_SITE_OTHER): Payer: Medicare Other | Admitting: Cardiology

## 2022-09-29 ENCOUNTER — Encounter: Payer: Self-pay | Admitting: Cardiology

## 2022-09-29 VITALS — BP 136/81 | HR 60 | Ht 74.0 in | Wt 270.1 lb

## 2022-09-29 DIAGNOSIS — I2510 Atherosclerotic heart disease of native coronary artery without angina pectoris: Secondary | ICD-10-CM | POA: Diagnosis not present

## 2022-09-29 DIAGNOSIS — I480 Paroxysmal atrial fibrillation: Secondary | ICD-10-CM

## 2022-09-29 DIAGNOSIS — E7800 Pure hypercholesterolemia, unspecified: Secondary | ICD-10-CM | POA: Diagnosis not present

## 2022-09-29 DIAGNOSIS — I10 Essential (primary) hypertension: Secondary | ICD-10-CM | POA: Diagnosis not present

## 2022-09-29 MED ORDER — DILTIAZEM HCL ER COATED BEADS 120 MG PO CP24: 120.0000 mg | ORAL_CAPSULE | Freq: Every day | ORAL | 3 refills | Status: DC

## 2022-09-29 NOTE — Patient Instructions (Signed)
Medication Instructions:   DECREASE ATENOLOL TO 25 MG ONCE DAILY X 3 DAYS AND THEN STOP=1/2 TABLET FOR 3 DAYS AND THEN STOP  ONCE ATENOLOL STOPPED START DILTIAZEM 120 MG ONCE DAILY  *If you need a refill on your cardiac medications before your next appointment, please call your pharmacy*   Follow-Up: At [facility], you and your health needs are our priority.  As part of our continuing mission to provide you with exceptional heart care, we have created designated Provider Care Teams.  These Care Teams include your primary Cardiologist (physician) and Advanced Practice Providers (APPs -  Physician Assistants and Nurse Practitioners) who all work together to provide you with the care you need, when you need it.  We recommend signing up for the patient portal called "MyChart".  Sign up information is provided on this After Visit Summary.  MyChart is used to connect with patients for Virtual Visits (Telemedicine).  Patients are able to view lab/test results, encounter notes, upcoming appointments, etc.  Non-urgent messages can be sent to your provider as well.   To learn more about what you can do with MyChart, go to [email or web address].    Your next appointment:   12 month(s)  The format for your next appointment:   In Person  Provider:   Kirk Ruths, MD

## 2022-09-30 LAB — COMPREHENSIVE METABOLIC PANEL
AG Ratio: 1.5 (calc) (ref 1.0–2.5)
ALT: 16 U/L (ref 9–46)
AST: 19 U/L (ref 10–35)
Albumin: 4 g/dL (ref 3.6–5.1)
Alkaline phosphatase (APISO): 54 U/L (ref 35–144)
BUN: 13 mg/dL (ref 7–25)
CO2: 27 mmol/L (ref 20–32)
Calcium: 9.2 mg/dL (ref 8.6–10.3)
Chloride: 106 mmol/L (ref 98–110)
Creat: 0.85 mg/dL (ref 0.70–1.28)
Globulin: 2.6 g/dL (calc) (ref 1.9–3.7)
Glucose, Bld: 115 mg/dL — ABNORMAL HIGH (ref 65–99)
Potassium: 4.4 mmol/L (ref 3.5–5.3)
Sodium: 142 mmol/L (ref 135–146)
Total Bilirubin: 0.5 mg/dL (ref 0.2–1.2)
Total Protein: 6.6 g/dL (ref 6.1–8.1)

## 2022-09-30 LAB — LIPID PANEL
Cholesterol: 138 mg/dL (ref <200)
HDL: 45 mg/dL (ref >=40)
LDL Cholesterol (Calc): 71 mg/dL (calc)
Non-HDL Cholesterol (Calc): 93 mg/dL (calc) (ref <130)
Total CHOL/HDL Ratio: 3.1 (calc) (ref <5.0)
Triglycerides: 137 mg/dL (ref <150)

## 2022-10-03 ENCOUNTER — Other Ambulatory Visit: Payer: Self-pay | Admitting: *Deleted

## 2022-10-03 ENCOUNTER — Ambulatory Visit (INDEPENDENT_AMBULATORY_CARE_PROVIDER_SITE_OTHER): Payer: Medicare Other | Admitting: Family Medicine

## 2022-10-03 DIAGNOSIS — E7800 Pure hypercholesterolemia, unspecified: Secondary | ICD-10-CM

## 2022-10-03 DIAGNOSIS — Z0000 Encounter for general adult medical examination without abnormal findings: Secondary | ICD-10-CM

## 2022-10-03 DIAGNOSIS — Z1211 Encounter for screening for malignant neoplasm of colon: Secondary | ICD-10-CM

## 2022-10-03 MED ORDER — EZETIMIBE 10 MG PO TABS: 10.0000 mg | ORAL_TABLET | Freq: Every day | ORAL | 3 refills | Status: DC

## 2022-10-03 NOTE — Progress Notes (Signed)
MEDICARE ANNUAL WELLNESS VISIT  10/03/2022  Telephone Visit Disclaimer This Medicare AWV was conducted by telephone due to national recommendations for restrictions regarding the COVID-19 Pandemic (e.g. social distancing).  I verified, using two identifiers, that I am speaking with Terry Acosta or their authorized healthcare agent. I discussed the limitations, risks, security, and privacy concerns of performing an evaluation and management service by telephone and the potential availability of an in-person appointment in the future. The patient expressed understanding and agreed to proceed.  Location of Patient: Home Location of Provider (nurse):  In the office.  Subjective:    Terry Acosta is a 71 y.o. male patient of Terry Nutting, DO who had a Medicare Annual Wellness Visit today via telephone. Terry Acosta is Retired and lives with their spouse. he has 2 children. he reports that he is socially active and does interact with friends/family regularly. he is moderately physically active and enjoys  photography, music and volunteering..  Patient Care Team: Terry Nutting, DO as PCP - General (Family Medicine) Terry Acosta Terry Bors, MD as PCP - Cardiology (Cardiology) Terry Acosta, [place] as Physician Assistant (Cardiology)     10/03/2022    3:02 PM 06/05/2021   10:23 AM 05/29/2021    9:08 AM 01/03/2021   11:09 AM 07/05/2020   11:25 AM  Advanced Directives  Does Patient Have a Medical Advance Directive? Yes Yes Yes Yes Yes  Type of Advance Directive Living will Healthcare Power of [place];Living will [place];Living will [place];Living will;Out of facility DNR (pink MOST or yellow form)  Does patient want to make changes to medical advance directive? No - Patient declined  No - Patient declined No - Patient declined No - Patient declined  Copy of [place] in Chart?   No - copy requested No - copy  requested     Hospital Utilization Over the Past 12 Months: # of hospitalizations or ER visits: 1 # of surgeries: 0  Review of Systems    Patient reports that his overall health is better compared to last year.  History obtained from chart review and the patient  Patient Reported Readings (BP, Pulse, CBG, Weight, etc) none  Pain Assessment Pain : No/denies pain     Current Medications & Allergies (verified) Allergies as of 10/03/2022       Reactions   Penicillins Other (See Comments)   Syncope   Shellfish Allergy         Medication List        Accurate as of October 03, 2022  3:18 PM. If you have any questions, ask your nurse or doctor.          AMBULATORY NON FORMULARY MEDICATION Please provide autopap machine with humidifier.  Pressure 6-16 cmH20.  Please provide supplies including mask per patient choice, filters, heated and humidified tubing.  Diagnosis: OSA G47.33   apixaban 5 MG Tabs tablet Commonly known as: ELIQUIS Take 1 tablet (5 mg total) by mouth 2 (two) times daily.   diltiazem 120 MG 24 hr capsule Commonly known as: CARDIZEM CD Take 1 capsule (120 mg total) by mouth daily.   esomeprazole 20 MG packet Commonly known as: NEXIUM   ezetimibe 10 MG tablet Commonly known as: ZETIA Take 1 tablet (10 mg total) by mouth daily. Started by: Fredia Beets, RN   fluticasone-salmeterol 250-50 MCG/ACT Aepb Commonly known as: Wixela Inhub Inhale 1 puff into the lungs in the morning and  at bedtime.   furosemide 40 MG tablet Commonly known as: LASIX Take 40 mg by mouth. As needed.   hydrOXYzine 25 MG capsule Commonly known as: VISTARIL Take 1 capsule (25 mg total) by mouth 2 (two) times daily.   lisinopril 10 MG tablet Commonly known as: ZESTRIL TAKE 1 TABLET EVERY DAY   multivitamin capsule Take 1 capsule by mouth daily.   Respiratory Therapy Supplies Devi by Does not apply route.   rosuvastatin 40 MG tablet Commonly known as:  CRESTOR TAKE 1 TABLET EVERY DAY   terbinafine 1 % cream Commonly known as: LAMISIL Apply 1 Application topically 2 (two) times daily.   terbinafine 250 MG tablet Commonly known as: LAMISIL Take 250 mg by mouth daily.   zolpidem 5 MG tablet Commonly known as: AMBIEN TAKE 1 TABLET AT BEDTIME        History (reviewed): Past Medical History:  Diagnosis Date   CAD (coronary artery disease)    Colon polyps    Diverticulitis    GERD (gastroesophageal reflux disease)    High cholesterol    History of kidney stones 07/01/2022   3 mm   Hypertension    Past Surgical History:  Procedure Laterality Date   ABDOMINAL PERINEAL BOWEL RESECTION     x 2   HERNIA REPAIR     x 6   KNEE ARTHROSCOPY Bilateral    TONSILLECTOMY     Family History  Problem Relation Age of Onset   Hypertension Father    Prostate cancer Father    Social History   Socioeconomic History   Marital status: Married    Spouse name: Terry Acosta   Number of children: 2   Years of education: 16   Highest education level: Bachelor's degree (e.g., BA, AB, BS)  Occupational History   Occupation: Self Employed   Occupation: Retired  Tobacco Use   Smoking status: Never   Smokeless tobacco: Never  Vaping Use   Vaping Use: Never used  Substance and Sexual Activity   Alcohol use: Yes    Alcohol/week: 5.0 standard drinks of alcohol    Types: 5 Standard drinks or equivalent per week   Drug use: Never   Sexual activity: Yes    Partners: Female  Other Topics Concern   Not on file  Social History Narrative   Lives with his wife. He enjoys photography, music and volunteering.   Social Determinants of Health   Financial Resource Strain: Low Risk  (10/03/2022)   Overall Financial Resource Strain (CARDIA)    Difficulty of Paying Living Expenses: Not hard at all  Food Insecurity: No Food Insecurity (10/03/2022)   Hunger Vital Sign    Worried About Running Out of Food in the Last Year: Never true    Ran  Out of Food in the Last Year: Never true  Transportation Needs: No Transportation Needs (10/03/2022)   PRAPARE - Hydrologist (Medical): No    Lack of Transportation (Non-Medical): No  Physical Activity: Sufficiently Active (10/03/2022)   Exercise Vital Sign    Days of Exercise per Week: 6 days    Minutes of Exercise per Session: 60 min  Stress: No Stress Concern Present (10/03/2022)   [place]    Feeling of Stress : Not at all  Social Connections: [place] (10/03/2022)   Social Connection and Isolation Panel [NHANES]    Frequency of Communication with Friends and Family: More than three times  a week    Frequency of Social Gatherings with Friends and Family: Twice a week    Attends Religious Services: More than 4 times per year    Active Member of [employer] or Organizations: Yes    Attends Archivist Meetings: More than 4 times per year    Marital Status: Married    Activities of Daily Living    10/03/2022    3:01 PM  In your present state of health, do you have any difficulty performing the following activities:  Hearing? 1  Vision? 0  Difficulty concentrating or making decisions? 0  Walking or climbing stairs? 0  Dressing or bathing? 0  Doing errands, shopping? 0  Preparing Food and eating ? N  Using the Toilet? N  In the past six months, have you accidently leaked urine? N  Do you have problems with loss of bowel control? N  Managing your Medications? N  Managing your Finances? N  Housekeeping or managing your Housekeeping? N    Patient Education/ Literacy How often do you need to have someone help you when you read instructions, pamphlets, or other written materials from your doctor or pharmacy?: 1 - Never What is the last grade level you completed in school?: Bachleor's degree  Exercise Current Exercise Habits: Home exercise routine, Type of exercise:  walking;strength training/weights, Time (Minutes): 50, Frequency (Times/Week): 6, Weekly Exercise (Minutes/Week): 300, Intensity: Moderate, Exercise limited by: None identified  Diet Patient reports consuming  2-3  meals a day and 1-2 snack(s) a day Patient reports that his primary diet is: Regular Patient reports that she does have regular access to food.   Depression Screen    10/03/2022    3:05 PM 07/22/2022   11:54 AM 01/01/2022    8:19 AM 05/29/2021    9:09 AM 07/05/2020   11:27 AM  PHQ 2/9 Scores  PHQ - 2 Score 0 2 2 0 1  PHQ- 9 Score   6  4     Fall Risk    10/03/2022    3:06 PM 01/01/2022    8:19 AM 05/29/2021    9:09 AM 02/26/2021   10:02 AM 07/05/2020   11:27 AM  Fall Risk   Falls in the past year? 0 0 0 0 0  Number falls in past yr: 0 0 0 0 0  Injury with Fall? 0 0 0 0 0  Risk for fall due to : No Fall Risks No Fall Risks No Fall Risks No Fall Risks No Fall Risks  Follow up Falls evaluation completed Falls evaluation completed Falls evaluation completed Falls evaluation completed      Objective:  Fannie Alomar seemed alert and oriented and he participated appropriately during our telephone visit.  Blood Pressure Weight BMI  BP Readings from Last 3 Encounters:  09/29/22 136/81  07/22/22 129/73  03/25/22 140/69   Wt Readings from Last 3 Encounters:  09/29/22 270 lb 1.9 oz (122.5 kg)  07/22/22 266 lb 1.9 oz (120.7 kg)  03/25/22 269 lb (122 kg)   BMI Readings from Last 1 Encounters:  09/29/22 34.68 kg/m    *Unable to obtain current vital signs, weight, and BMI due to telephone visit type  Hearing/Vision  [place] did not seem to have difficulty with hearing/understanding during the telephone conversation Reports that he has had a formal eye exam by an eye care professional within the past year Reports that he has had a formal hearing evaluation within the past year *Unable to fully assess  hearing and vision during telephone visit type  Cognitive Function:     10/03/2022    3:07 PM 05/29/2021    9:15 AM  6CIT Screen  What Year? 0 points 0 points  What month? 0 points 0 points  What time? 0 points 0 points  Count back from 20 0 points 0 points  Months in reverse 0 points 0 points  Repeat phrase 0 points 0 points  Total Score 0 points 0 points   (Normal:0-7, Significant for Dysfunction: >8)  Normal Cognitive Function Screening: Yes   Immunization & Health Maintenance Record Immunization History  Administered Date(s) Administered   Influenza Split 09/30/2012, 09/08/2016   Influenza Whole 08/31/2017, 09/01/2019   Influenza, High Dose Seasonal PF 09/15/2014, 09/13/2015, 09/22/2017, 09/02/2018, 09/13/2021, 09/24/2022   Influenza, Seasonal, Injecte, Preservative Fre 12/03/2013   Influenza-Unspecified 08/01/2020   Moderna Sars-Covid-2 Vaccination 02/08/2020, 03/07/2020, 10/01/2020   Pneumococcal Conjugate PCV 7 08/31/2017   Pneumococcal Conjugate-13 08/31/2017   Pneumococcal Polysaccharide-23 12/14/2014   Pneumococcal-Unspecified 08/31/2017   Td 12/15/2013   Zoster Recombinat (Shingrix) 09/02/2018, 11/01/2018, 01/26/2019   Zoster, Live 12/14/2014    Health Maintenance  Topic Date Due   COVID-19 Vaccine (4 - Moderna risk series) 10/19/2022 (Originally 11/26/2020)   COLONOSCOPY (Pts 45-73yr Insurance coverage will need to be confirmed)  10/04/2023 (Originally 07/30/2021)   Hepatitis C Screening  10/04/2023 (Originally 02/07/1969)   Medicare Annual Wellness (AWV)  10/04/2023   TETANUS/TDAP  12/16/2023   Pneumonia Vaccine 71 Years old  Completed   INFLUENZA VACCINE  Completed   Zoster Vaccines- Shingrix  Completed   HPV VACCINES  Aged Out       Assessment  This is a routine wellness examination for [facility]  Health Maintenance: Due or Overdue There are no preventive care reminders to display for this patient.   Jaeceon Brizzi does not need a referral for Community Assistance: Care Management:   no Social  Work:    no Prescription Assistance:  no Nutrition/Diabetes Education:  no   Plan:  Personalized Goals  Goals Addressed               This Visit's Progress     Patient Stated (pt-stated)        Patient stated that he would like 10-15 lbs.       Personalized Health Maintenance & Screening Recommendations  Colorectal cancer screening  Lung Cancer Screening Recommended: no (Low Dose CT Chest recommended if Age 73-80years, 30 pack-year currently smoking OR have quit w/in past 15 years) Hepatitis C Screening recommended: yes HIV Screening recommended: no  Advanced Directives: Written information was not prepared per patient's request.  Referrals & Orders Orders Placed This Encounter  Procedures   Ambulatory referral to Gastroenterology (for Colonoscopy)    Follow-up Plan Follow-up with MLuetta Nutting DO as planned Medicare wellness visit in one year.  Patient will access AVS on my chart.   I have personally reviewed and noted the following in the patient's chart:   Medical and social history Use of alcohol, tobacco or illicit drugs  Current medications and supplements Functional ability and status Nutritional status Physical activity Advanced directives List of other physicians Hospitalizations, surgeries, and ER visits in previous 12 months Vitals Screenings to include cognitive, depression, and falls Referrals and appointments  In addition, I have reviewed and discussed with CMila Merrycertain preventive protocols, quality metrics, and best practice recommendations. A written personalized care plan for preventive services as well as general preventive health recommendations is  available and can be mailed to the patient at his request.      Tinnie Gens, RN BSN  10/03/2022

## 2022-10-03 NOTE — Patient Instructions (Signed)
[place] Maintenance Summary and Written Plan of Care  [name] ,  Thank you for allowing me to perform your Medicare Annual Wellness Visit and for your ongoing commitment to your health.   Health Maintenance & Immunization History Health Maintenance  Topic Date Due   COVID-19 Vaccine (4 - Moderna risk series) 10/19/2022 (Originally 11/26/2020)   COLONOSCOPY (Pts 45-68yr Insurance coverage will need to be confirmed)  10/04/2023 (Originally 07/30/2021)   Hepatitis C Screening  10/04/2023 (Originally 02/07/1969)   Medicare Annual Wellness (AWV)  10/04/2023   TETANUS/TDAP  12/16/2023   Pneumonia Vaccine 73 Years old  Completed   INFLUENZA VACCINE  Completed   Zoster Vaccines- Shingrix  Completed   HPV VACCINES  Aged Out   Immunization History  Administered Date(s) Administered   Influenza Split 09/30/2012, 09/08/2016   Influenza Whole 08/31/2017, 09/01/2019   Influenza, High Dose Seasonal PF 09/15/2014, 09/13/2015, 09/22/2017, 09/02/2018, 09/13/2021, 09/24/2022   Influenza, Seasonal, Injecte, Preservative Fre 12/03/2013   Influenza-Unspecified 08/01/2020   Moderna Sars-Covid-2 Vaccination 02/08/2020, 03/07/2020, 10/01/2020   Pneumococcal Conjugate PCV 7 08/31/2017   Pneumococcal Conjugate-13 08/31/2017   Pneumococcal Polysaccharide-23 12/14/2014   Pneumococcal-Unspecified 08/31/2017   Td 12/15/2013   Zoster Recombinat (Shingrix) 09/02/2018, 11/01/2018, 01/26/2019   Zoster, Live 12/14/2014    These are the patient goals that we discussed:  Goals Addressed               This Visit's Progress     Patient Stated (pt-stated)        Patient stated that he would like 10-15 lbs.         This is a list of Health Maintenance Items that are overdue or due now: There are no preventive care reminders to display for this patient.    Orders/Referrals Placed Today: No orders of the defined types were placed in this encounter.  (Contact our  referral department at [phone] you have not spoken with someone about your referral appointment within the next 5 days)    Follow-up Plan Follow-up with MLuetta Nutting DO as planned Medicare wellness visit in one year.  Patient will access AVS on my chart.      Health Maintenance, Male Adopting a healthy lifestyle and getting preventive care are important in promoting health and wellness. Ask your health care provider about: The right schedule for you to have regular tests and exams. Things you can do on your own to prevent diseases and keep yourself healthy. What should I know about diet, weight, and exercise? Eat a healthy diet  Eat a diet that includes plenty of vegetables, fruits, low-fat dairy products, and lean protein. Do not eat a lot of foods that are high in solid fats, added sugars, or sodium. Maintain a healthy weight Body mass index (BMI) is a measurement that can be used to identify possible weight problems. It estimates body fat based on height and weight. Your health care provider can help determine your BMI and help you achieve or maintain a healthy weight. Get regular exercise Get regular exercise. This is one of the most important things you can do for your health. Most adults should: Exercise for at least 150 minutes each week. The exercise should increase your heart rate and make you sweat (moderate-intensity exercise). Do strengthening exercises at least twice a week. This is in addition to the moderate-intensity exercise. Spend less time sitting. Even light physical activity can be beneficial. Watch cholesterol and blood lipids Have your blood tested  for lipids and cholesterol at 73 years of age, then have this test every 5 years. You may need to have your cholesterol levels checked more often if: Your lipid or cholesterol levels are high. You are older than 73 years of age. You are at high risk for heart disease. What should I know about cancer  screening? Many types of cancers can be detected early and may often be prevented. Depending on your health history and family history, you may need to have cancer screening at various ages. This may include screening for: Colorectal cancer. Prostate cancer. Skin cancer. Lung cancer. What should I know about heart disease, diabetes, and high blood pressure? Blood pressure and heart disease High blood pressure causes heart disease and increases the risk of stroke. This is more likely to develop in people who have high blood pressure readings or are overweight. Talk with your health care provider about your target blood pressure readings. Have your blood pressure checked: Every 3-5 years if you are 73-73 years of age. Every year if you are 58 years old or older. If you are between the ages of 73 and 73 and are a current or former smoker, ask your health care provider if you should have a one-time screening for abdominal aortic aneurysm (AAA). Diabetes Have regular diabetes screenings. This checks your fasting blood sugar level. Have the screening done: Once every three years after age 73 if you are at a normal weight and have a low risk for diabetes. More often and at a younger age if you are overweight or have a high risk for diabetes. What should I know about preventing infection? Hepatitis B If you have a higher risk for hepatitis B, you should be screened for this virus. Talk with your health care provider to find out if you are at risk for hepatitis B infection. Hepatitis C Blood testing is recommended for: Everyone born from [birth year] through 1965. Anyone with known risk factors for hepatitis C. Sexually transmitted infections (STIs) You should be screened each year for STIs, including gonorrhea and chlamydia, if: You are sexually active and are younger than 73 years of age. You are older than 73 years of age and your health care provider tells you that you are at risk for this type of  infection. Your sexual activity has changed since you were last screened, and you are at increased risk for chlamydia or gonorrhea. Ask your health care provider if you are at risk. Ask your health care provider about whether you are at high risk for HIV. Your health care provider may recommend a prescription medicine to help prevent HIV infection. If you choose to take medicine to prevent HIV, you should first get tested for HIV. You should then be tested every 3 months for as long as you are taking the medicine. Follow these instructions at home: Alcohol use Do not drink alcohol if your health care provider tells you not to drink. If you drink alcohol: Limit how much you have to 0-2 drinks a day. Know how much alcohol is in your drink. In the [place] one drink equals one 12 oz bottle of beer (355 mL), one 5 oz glass of wine (148 mL), or one 1 oz glass of hard liquor (44 mL). Lifestyle Do not use any products that contain nicotine or tobacco. These products include cigarettes, chewing tobacco, and vaping devices, such as e-cigarettes. If you need help quitting, ask your health care provider. Do not use street drugs. Do not  share needles. Ask your health care provider for help if you need support or information about quitting drugs. General instructions Schedule regular health, dental, and eye exams. Stay current with your vaccines. Tell your health care provider if: You often feel depressed. You have ever been abused or do not feel safe at home. Summary Adopting a healthy lifestyle and getting preventive care are important in promoting health and wellness. Follow your health care provider's instructions about healthy diet, exercising, and getting tested or screened for diseases. Follow your health care provider's instructions on monitoring your cholesterol and blood pressure. This information is not intended to replace advice given to you by your health care provider. Make sure you discuss  any questions you have with your health care provider. Document Revised: 04/08/2021 Document Reviewed: 04/08/2021 Elsevier Patient Education  [place].

## 2022-12-05 DIAGNOSIS — E7800 Pure hypercholesterolemia, unspecified: Secondary | ICD-10-CM | POA: Diagnosis not present

## 2022-12-06 LAB — LIPID PANEL
Chol/HDL Ratio: 2.6 ratio (ref 0.0–5.0)
Cholesterol, Total: 123 mg/dL (ref 100–199)
HDL: 48 mg/dL (ref >39)
LDL Chol Calc (NIH): 60 mg/dL (ref 0–99)
Triglycerides: 74 mg/dL (ref 0–149)
VLDL Cholesterol Cal: 15 mg/dL (ref 5–40)

## 2022-12-06 LAB — HEPATIC FUNCTION PANEL
ALT: 24 IU/L (ref 0–44)
AST: 25 IU/L (ref 0–40)
Albumin: 4.3 g/dL (ref 3.8–4.8)
Alkaline Phosphatase: 63 IU/L (ref 44–121)
Bilirubin Total: 0.4 mg/dL (ref 0.0–1.2)
Bilirubin, Direct: 0.12 mg/dL (ref 0.00–0.40)
Total Protein: 6.6 g/dL (ref 6.0–8.5)

## 2022-12-09 ENCOUNTER — Encounter: Payer: Self-pay | Admitting: Family Medicine

## 2022-12-09 MED ORDER — HYDROXYZINE PAMOATE 25 MG PO CAPS: 25.0000 mg | ORAL_CAPSULE | Freq: Two times a day (BID) | ORAL | 3 refills | Status: DC

## 2022-12-21 ENCOUNTER — Other Ambulatory Visit: Payer: Self-pay | Admitting: Family Medicine

## 2022-12-22 ENCOUNTER — Encounter: Payer: Self-pay | Admitting: Family Medicine

## 2022-12-22 ENCOUNTER — Other Ambulatory Visit: Payer: Self-pay | Admitting: *Deleted

## 2022-12-22 ENCOUNTER — Encounter: Payer: Self-pay | Admitting: Cardiology

## 2022-12-22 DIAGNOSIS — E7800 Pure hypercholesterolemia, unspecified: Secondary | ICD-10-CM

## 2022-12-22 MED ORDER — APIXABAN 5 MG PO TABS: 5.0000 mg | ORAL_TABLET | Freq: Two times a day (BID) | ORAL | 1 refills | Status: DC

## 2022-12-22 MED ORDER — ZOLPIDEM TARTRATE 5 MG PO TABS: 5.0000 mg | ORAL_TABLET | Freq: Every day | ORAL | 1 refills | Status: DC

## 2022-12-22 MED ORDER — EZETIMIBE 10 MG PO TABS: 10.0000 mg | ORAL_TABLET | Freq: Every day | ORAL | 0 refills | Status: DC

## 2022-12-22 MED ORDER — ROSUVASTATIN CALCIUM 40 MG PO TABS: 40.0000 mg | ORAL_TABLET | Freq: Every day | ORAL | 3 refills | Status: DC

## 2022-12-22 NOTE — Addendum Note (Signed)
Addended by: Lutricia Feil on: 12/22/2022 04:52 PM   Modules accepted: Orders

## 2022-12-22 NOTE — Telephone Encounter (Signed)
Prescription refill request for Eliquis received. Indication: afib  Last office visit: [place], 09/29/2022 Scr: 0.85, 09/29/2022 Age: 72 yo  Weight: 122.5kg   Refill sent.

## 2023-01-12 ENCOUNTER — Encounter: Payer: Self-pay | Admitting: Cardiology

## 2023-01-12 DIAGNOSIS — I2510 Atherosclerotic heart disease of native coronary artery without angina pectoris: Secondary | ICD-10-CM

## 2023-01-16 DIAGNOSIS — N201 Calculus of ureter: Secondary | ICD-10-CM | POA: Diagnosis not present

## 2023-01-22 ENCOUNTER — Ambulatory Visit (INDEPENDENT_AMBULATORY_CARE_PROVIDER_SITE_OTHER): Payer: Medicare Other | Admitting: Family Medicine

## 2023-01-22 ENCOUNTER — Encounter: Payer: Self-pay | Admitting: Family Medicine

## 2023-01-22 VITALS — BP 129/70 | HR 64 | Ht 74.0 in | Wt 274.0 lb

## 2023-01-22 DIAGNOSIS — M25561 Pain in right knee: Secondary | ICD-10-CM | POA: Diagnosis not present

## 2023-01-22 DIAGNOSIS — I480 Paroxysmal atrial fibrillation: Secondary | ICD-10-CM | POA: Diagnosis not present

## 2023-01-22 DIAGNOSIS — E7800 Pure hypercholesterolemia, unspecified: Secondary | ICD-10-CM

## 2023-01-22 DIAGNOSIS — F5101 Primary insomnia: Secondary | ICD-10-CM

## 2023-01-22 DIAGNOSIS — I10 Essential (primary) hypertension: Secondary | ICD-10-CM | POA: Diagnosis not present

## 2023-01-22 DIAGNOSIS — N400 Enlarged prostate without lower urinary tract symptoms: Secondary | ICD-10-CM

## 2023-01-22 DIAGNOSIS — I700 Atherosclerosis of aorta: Secondary | ICD-10-CM

## 2023-01-22 DIAGNOSIS — M25562 Pain in left knee: Secondary | ICD-10-CM

## 2023-01-22 DIAGNOSIS — M25569 Pain in unspecified knee: Secondary | ICD-10-CM | POA: Insufficient documentation

## 2023-01-22 DIAGNOSIS — G8929 Other chronic pain: Secondary | ICD-10-CM

## 2023-01-22 DIAGNOSIS — Z96653 Presence of artificial knee joint, bilateral: Secondary | ICD-10-CM | POA: Diagnosis not present

## 2023-01-22 NOTE — Assessment & Plan Note (Signed)
Continue with ambien as needed.  No adverse side effects at this time.

## 2023-01-22 NOTE — Assessment & Plan Note (Signed)
Doing well with crestor and zetia at current strength.  Lipids are at goal.

## 2023-01-22 NOTE — Assessment & Plan Note (Signed)
Update PSA today.

## 2023-01-22 NOTE — Assessment & Plan Note (Signed)
Management per cardiology.  Remains rate controlled with diltiazem.  Anticoagulated with eliquis and tolerating well.

## 2023-01-22 NOTE — Assessment & Plan Note (Signed)
Noted on previous CT of the abdomen and pelvis.  Continue aggressive treatment with statin.

## 2023-01-22 NOTE — Assessment & Plan Note (Signed)
Bilateral in nature.  Can try voltaren gel.  Referral to ortho entered.

## 2023-01-22 NOTE — Progress Notes (Addendum)
Terry Acosta - 72 y.o. male MRN [id number]  Date of birth: 1950-12-24  Subjective Chief Complaint  Patient presents with   Hypertension    HPI Terry Acosta is a 72 y.o. male here today for follow up visit.   Reports that he is having some increased knee pain.  History of b/l knee replacement.     He continues to see cardiology for history of A. Fib and CAD.  Remains on ditiazem and rate is well controlled.  He is anticoagulated with Eliquis and tolerating this well.  Additionally, he is on lisinopril for BP management.  BP is well controlled today  He denies chest pain, shortness of breath, palpitations, headache or vision changes.    Lipids are being managed by cardiology as well.  Currently on crestor and zetia.  He is tolerating these well.  Had lipids checked in January.   Insomnia remains well controlled with ambien as needed.   ROS:  A comprehensive ROS was completed and negative except as noted per HPI  Lab Results  Component Value Date   LDLCALC 60 12/05/2022    Allergies  Allergen Reactions   Penicillins Other (See Comments)    Syncope   Shellfish Allergy     Past Medical History:  Diagnosis Date   CAD (coronary artery disease)    Colon polyps    Diverticulitis    GERD (gastroesophageal reflux disease)    High cholesterol    History of kidney stones 07/01/2022   3 mm   Hypertension     Past Surgical History:  Procedure Laterality Date   ABDOMINAL PERINEAL BOWEL RESECTION     x 2   HERNIA REPAIR     x 6   KNEE ARTHROSCOPY Bilateral    TONSILLECTOMY      Social History   Socioeconomic History   Marital status: Married    Spouse name: Abell Salzberg   Number of children: 2   Years of education: 16   Highest education level: Bachelor's degree (e.g., BA, AB, BS)  Occupational History   Occupation: Self Employed   Occupation: Retired  Tobacco Use   Smoking status: Never   Smokeless tobacco: Never  Vaping Use   Vaping Use: Never used   Substance and Sexual Activity   Alcohol use: Yes    Alcohol/week: 5.0 standard drinks of alcohol    Types: 5 Standard drinks or equivalent per week   Drug use: Never   Sexual activity: Yes    Partners: Female  Other Topics Concern   Not on file  Social History Narrative   Lives with his wife. He enjoys photography, music and volunteering.   Social Determinants of Health   Financial Resource Strain: Low Risk  (10/03/2022)   Overall Financial Resource Strain (CARDIA)    Difficulty of Paying Living Expenses: Not hard at all  Food Insecurity: No Food Insecurity (10/03/2022)   Hunger Vital Sign    Worried About Running Out of Food in the Last Year: Never true    Ran Out of Food in the Last Year: Never true  Transportation Needs: No Transportation Needs (10/03/2022)   PRAPARE - Hydrologist (Medical): No    Lack of Transportation (Non-Medical): No  Physical Activity: Sufficiently Active (10/03/2022)   Exercise Vital Sign    Days of Exercise per Week: 6 days    Minutes of Exercise per Session: 60 min  Stress: No Stress Concern Present (10/03/2022)   [employer] - Occupational Stress Questionnaire    Feeling of Stress : Not at all  Social Connections: Socially Integrated (10/03/2022)   Social Connection and Isolation Panel [NHANES]    Frequency of Communication with Friends and Family: More than three times a week    Frequency of Social Gatherings with Friends and Family: Twice a week    Attends Religious Services: More than 4 times per year    Active Member of Clubs or Organizations: Yes    Attends Music therapist: More than 4 times per year    Marital Status: Married    Family History  Problem Relation Age of Onset   Hypertension Father    Prostate cancer Father     Health Maintenance  Topic Date Due   COLONOSCOPY (Pts 45-39yr Insurance coverage will need to be confirmed)  10/04/2023 (Originally 07/30/2021)    Hepatitis C Screening  10/04/2023 (Originally [birth year]07/1969)   COVID-19 Vaccine (4 - 2023-24 season) 02/07/2024 (Originally 08/01/2022)   Medicare Annual Wellness (AWV)  10/04/2023   DTaP/Tdap/Td (2 - Tdap) 12/16/2023   Pneumonia Vaccine 72 Years old  Completed   INFLUENZA VACCINE  Completed   Zoster Vaccines- Shingrix  Completed   HPV VACCINES  Aged Out     ----------------------------------------------------------------------------------------------------------------------------------------------------------------------------------------------------------------- Physical Exam BP 129/70 (BP Location: Left Arm, Patient Position: Sitting, Cuff Size: Large)   Pulse 64   Ht 6' 2"$  (1.88 m)   Wt 274 lb (124.3 kg)   SpO2 99%   BMI 35.18 kg/m   Physical Exam Constitutional:      Appearance: Normal appearance.  HENT:     Head: Normocephalic and atraumatic.  Eyes:     General: No scleral icterus. Cardiovascular:     Rate and Rhythm: Normal rate and regular rhythm.  Pulmonary:     Effort: Pulmonary effort is normal.     Breath sounds: Normal breath sounds.  Neurological:     Mental Status: He is alert.  Psychiatric:        Mood and Affect: Mood normal.        Behavior: Behavior normal.     ------------------------------------------------------------------------------------------------------------------------------------------------------------------------------------------------------------------- Assessment and Plan  Essential hypertension BP is well controlled at this time.  Continue current medications for management of HTN.  Updated labs ordered today.   PAF (paroxysmal atrial fibrillation) ([facility] [facility] [place] cardiology.  Remains rate controlled with diltiazem.  Anticoagulated with eliquis and tolerating well.   Benign prostatic hyperplasia without lower urinary tract symptoms Update PSA today.   Hyperlipidemia Doing well with crestor and zetia at current strength.   Lipids are at goal.   Insomnia Continue with ambien as needed.  No adverse side effects at this time.   Knee pain Bilateral in nature.  Can try voltaren gel.  Referral to ortho entered.   Aortic atherosclerosis ([place] Noted on previous CT of the abdomen and pelvis.  Continue aggressive treatment with statin.    No orders of the defined types were placed in this encounter.   Return in about 6 months (around 07/23/2023) for HTN.    This visit occurred during the SARS-CoV-2 public health emergency.  Safety protocols were in place, including screening questions prior to the visit, additional usage of staff PPE, and extensive cleaning of exam room while observing appropriate contact time as indicated for disinfecting solutions.

## 2023-01-22 NOTE — Assessment & Plan Note (Signed)
BP is well controlled at this time.  Continue current medications for management of HTN.  Updated labs ordered today.

## 2023-01-23 LAB — COMPLETE METABOLIC PANEL WITH GFR
AG Ratio: 1.4 (calc) (ref 1.0–2.5)
ALT: 21 U/L (ref 9–46)
AST: 22 U/L (ref 10–35)
Albumin: 3.7 g/dL (ref 3.6–5.1)
Alkaline phosphatase (APISO): 56 U/L (ref 35–144)
BUN: 13 mg/dL (ref 7–25)
CO2: 28 mmol/L (ref 20–32)
Calcium: 8.9 mg/dL (ref 8.6–10.3)
Chloride: 108 mmol/L (ref 98–110)
Creat: 0.75 mg/dL (ref 0.70–1.28)
Globulin: 2.7 g/dL (calc) (ref 1.9–3.7)
Glucose, Bld: 126 mg/dL — ABNORMAL HIGH (ref 65–99)
Potassium: 4.6 mmol/L (ref 3.5–5.3)
Sodium: 143 mmol/L (ref 135–146)
Total Bilirubin: 0.4 mg/dL (ref 0.2–1.2)
Total Protein: 6.4 g/dL (ref 6.1–8.1)
eGFR: 95 mL/min/{1.73_m2} (ref >=60)

## 2023-01-23 LAB — CBC WITH DIFFERENTIAL/PLATELET
Absolute Monocytes: 517 cells/uL (ref 200–950)
Basophils Absolute: 110 cells/uL (ref 0–200)
Basophils Relative: 2 %
Eosinophils Absolute: 198 cells/uL (ref 15–500)
Eosinophils Relative: 3.6 %
HCT: 42.8 % (ref 38.5–50.0)
Hemoglobin: 14 g/dL (ref 13.2–17.1)
Lymphs Abs: 1507 cells/uL (ref 850–3900)
MCH: 29.4 pg (ref 27.0–33.0)
MCHC: 32.7 g/dL (ref 32.0–36.0)
MCV: 89.9 fL (ref 80.0–100.0)
MPV: 11 fL (ref 7.5–12.5)
Monocytes Relative: 9.4 %
Neutro Abs: 3168 cells/uL (ref 1500–7800)
Neutrophils Relative %: 57.6 %
Platelets: 222 10*3/uL (ref 140–400)
RBC: 4.76 10*6/uL (ref 4.20–5.80)
RDW: 13 % (ref 11.0–15.0)
Total Lymphocyte: 27.4 %
WBC: 5.5 10*3/uL (ref 3.8–10.8)

## 2023-01-23 LAB — PSA: PSA: 2.2 ng/mL (ref <=4.00)

## 2023-01-23 LAB — TSH: TSH: 1.34 mIU/L (ref 0.40–4.50)

## 2023-01-28 DIAGNOSIS — L280 Lichen simplex chronicus: Secondary | ICD-10-CM | POA: Diagnosis not present

## 2023-01-28 DIAGNOSIS — L570 Actinic keratosis: Secondary | ICD-10-CM | POA: Diagnosis not present

## 2023-01-28 DIAGNOSIS — B351 Tinea unguium: Secondary | ICD-10-CM | POA: Diagnosis not present

## 2023-01-28 DIAGNOSIS — B353 Tinea pedis: Secondary | ICD-10-CM | POA: Diagnosis not present

## 2023-01-28 DIAGNOSIS — L853 Xerosis cutis: Secondary | ICD-10-CM | POA: Diagnosis not present

## 2023-01-28 MED ORDER — LISINOPRIL 10 MG PO TABS: 10.0000 mg | ORAL_TABLET | Freq: Every day | ORAL | 1 refills | Status: DC

## 2023-02-09 DIAGNOSIS — M25562 Pain in left knee: Secondary | ICD-10-CM | POA: Diagnosis not present

## 2023-02-09 DIAGNOSIS — M25561 Pain in right knee: Secondary | ICD-10-CM | POA: Diagnosis not present

## 2023-02-25 DIAGNOSIS — G4733 Obstructive sleep apnea (adult) (pediatric): Secondary | ICD-10-CM | POA: Diagnosis not present

## 2023-02-26 ENCOUNTER — Encounter: Payer: Self-pay | Admitting: Internal Medicine

## 2023-03-10 DIAGNOSIS — M25561 Pain in right knee: Secondary | ICD-10-CM | POA: Diagnosis not present

## 2023-03-10 DIAGNOSIS — M25562 Pain in left knee: Secondary | ICD-10-CM | POA: Diagnosis not present

## 2023-03-11 ENCOUNTER — Telehealth: Payer: Self-pay | Admitting: *Deleted

## 2023-03-11 NOTE — Telephone Encounter (Signed)
   Pre-operative Risk Assessment    Patient Name: Terry Acosta  DOB: 1951/03/31 MRN: [id number]     Request for Surgical Clearance    Procedure:   DENTAL IMPLANT PLACE OF 1 TOOTH (TOOTH # 19)  Date of Surgery:  Clearance TBD                                 Surgeon:  DR. Inda Coke, DDS Surgeon's Group or Practice Name:  [facility] Phone number:  [phone] Fax number:  [phone]   Type of Clearance Requested:   - Medical  - Pharmacy:  Hold Apixaban (Eliquis) x 2 DAYS   Type of Anesthesia:  Local WITH EPI   Additional requests/questions:    Elpidio Anis   03/11/2023, 2:50 PM

## 2023-03-12 ENCOUNTER — Telehealth: Payer: Self-pay | Admitting: *Deleted

## 2023-03-12 NOTE — Telephone Encounter (Signed)
Pt has been scheduled for tele pre op appt 03/18/23 @ 1:40. Med rec and consent are done.     Patient Consent for Virtual Visit        Terry Acosta has provided verbal consent on 03/12/2023 for a virtual visit (video or telephone).   CONSENT FOR VIRTUAL VISIT FOR:  Terry Acosta  By participating in this virtual visit I agree to the following:  I hereby voluntarily request, consent and authorize [facility] and its employed or contracted physicians, physician assistants, nurse practitioners or other licensed health care professionals (the Practitioner), to provide me with telemedicine health care services (the "Services") as deemed necessary by the treating Practitioner. I acknowledge and consent to receive the Services by the Practitioner via telemedicine. I understand that the telemedicine visit will involve communicating with the Practitioner through live audiovisual communication technology and the disclosure of certain medical information by electronic transmission. I acknowledge that I have been given the opportunity to request an in-person assessment or other available alternative prior to the telemedicine visit and am voluntarily participating in the telemedicine visit.  I understand that I have the right to withhold or withdraw my consent to the use of telemedicine in the course of my care at any time, without affecting my right to future care or treatment, and that the Practitioner or I may terminate the telemedicine visit at any time. I understand that I have the right to inspect all information obtained and/or recorded in the course of the telemedicine visit and may receive copies of available information for a reasonable fee.  I understand that some of the potential risks of receiving the Services via telemedicine include:  Delay or interruption in medical evaluation due to technological equipment failure or disruption; Information transmitted may not be sufficient  (e.g. poor resolution of images) to allow for appropriate medical decision making by the Practitioner; and/or  In rare instances, security protocols could fail, causing a breach of personal health information.  Furthermore, I acknowledge that it is my responsibility to provide information about my medical history, conditions and care that is complete and accurate to the best of my ability. I acknowledge that Practitioner's advice, recommendations, and/or decision may be based on factors not within their control, such as incomplete or inaccurate data provided by me or distortions of diagnostic images or specimens that may result from electronic transmissions. I understand that the practice of medicine is not an exact science and that Practitioner makes no warranties or guarantees regarding treatment outcomes. I acknowledge that a copy of this consent can be made available to me via my patient portal [facility] MyChart), or I can request a printed copy by calling the office of [facility].    I understand that my insurance will be billed for this visit.   I have read or had this consent read to me. I understand the contents of this consent, which adequately explains the benefits and risks of the Services being provided via telemedicine.  I have been provided ample opportunity to ask questions regarding this consent and the Services and have had my questions answered to my satisfaction. I give my informed consent for the services to be provided through the use of telemedicine in my medical care

## 2023-03-12 NOTE — Telephone Encounter (Signed)
Pt has been scheduled for tele pre op appt 03/18/23 @ 1:40. Med rec and consent are done.

## 2023-03-12 NOTE — Telephone Encounter (Signed)
Patient with diagnosis of afib on Eliquis for anticoagulation.    Procedure: dental implant of 1 tooth Date of procedure: TBD  CHA2DS2-VASc Score = 3  This indicates a 3.2% annual risk of stroke. The patient's score is based upon: CHF History: 0 HTN History: 1 Diabetes History: 0 Stroke History: 0 Vascular Disease History: 1 Age Score: 1 Gender Score: 0   CrCl >122mL/min Platelet count 222K  Patient does not require pre-op antibiotics for dental procedure.  Should be a low bleed risk procedure but CV risk is also lower. Ok to hold Eliquis for 2 days prior to procedure as requested.  **This guidance is not considered finalized until pre-operative APP has relayed final recommendations.**

## 2023-03-12 NOTE — Telephone Encounter (Signed)
   Name: Terry Acosta  DOB: [birth year]  MRN: [id number]  Primary Cardiologist: Olga Millers, MD   Preoperative team, please contact this patient and set up a phone call appointment for further preoperative risk assessment. Please obtain consent and complete medication review. Thank you for your help.  I confirm that guidance regarding antiplatelet and oral anticoagulation therapy has been completed and, if necessary, noted below. Per pharm D: Patient with diagnosis of afib on Eliquis for anticoagulation.     Procedure: dental implant of 1 tooth Date of procedure: TBD   CHA2DS2-VASc Score = 3  This indicates a 3.2% annual risk of stroke. The patient's score is based upon: CHF History: 0 HTN History: 1 Diabetes History: 0 Stroke History: 0 Vascular Disease History: 1 Age Score: 1 Gender Score: 0   CrCl >188mL/min Platelet count 222K   Patient does not require pre-op antibiotics for dental procedure.   Should be a low bleed risk procedure but CV risk is also lower. Ok to hold Eliquis for 2 days prior to procedure as requested.  Carlos Levering, NP 03/12/2023, 8:45 AM [facility]

## 2023-03-17 ENCOUNTER — Encounter: Payer: Self-pay | Admitting: Cardiology

## 2023-03-17 NOTE — Telephone Encounter (Signed)
Pt called in, he needs to r/s his preop tele visit tomorrow

## 2023-03-17 NOTE — Telephone Encounter (Signed)
I s/w the pt who had to reschedule his tele pre op appt for 03/18/23. New tele appt is 03/25/23 @ 9 am. I will update all parties involved.

## 2023-03-18 ENCOUNTER — Ambulatory Visit: Payer: Medicare Other

## 2023-03-21 ENCOUNTER — Other Ambulatory Visit: Payer: Self-pay | Admitting: Cardiology

## 2023-03-21 DIAGNOSIS — E7800 Pure hypercholesterolemia, unspecified: Secondary | ICD-10-CM

## 2023-03-25 ENCOUNTER — Ambulatory Visit: Payer: Medicare Other | Attending: Internal Medicine | Admitting: Nurse Practitioner

## 2023-03-25 DIAGNOSIS — Z01810 Encounter for preprocedural cardiovascular examination: Secondary | ICD-10-CM | POA: Diagnosis not present

## 2023-03-25 NOTE — Progress Notes (Signed)
Virtual Visit via Telephone Note   Because of Terry Acosta's co-morbid illnesses, he is at least at moderate risk for complications without adequate follow up.  This format is felt to be most appropriate for this patient at this time.  The patient did not have access to video technology/had technical difficulties with video requiring transitioning to audio format only (telephone).  All issues noted in this document were discussed and addressed.  No physical exam could be performed with this format.  Please refer to the patient's chart for his consent to telehealth for [facility].  Evaluation Performed:  Preoperative cardiovascular risk assessment _____________   Date:  03/25/2023   Patient ID:  Terry Acosta, DOB [birth year], MRN [id number] Patient Location:  Home Provider location:   Office  Primary Care Provider:  Everrett Coombe, DO Primary Cardiologist:  Olga Millers, MD  Chief Complaint / Patient Profile   72 y.o. y/o male with a h/o CAD, paroxysmal atrial fibrillation, hypertension, and hyperlipidemia who is pending dental implant (place of 1 tooth) with Dr. Inda Coke, DDS, of [facility] and presents today for telephonic preoperative cardiovascular risk assessment.  History of Present Illness    Terry Acosta is a 72 y.o. male who presents via audio/video conferencing for a telehealth visit today.  Pt was last seen in cardiology clinic on 09/29/2022 by Dr. Jens Som.  At that time Terry Acosta was doing well.  The patient is now pending procedure as outlined above. Since his last visit, he is done well from a cardiac standpoint.  He notes very brief episodes of breakthrough atrial fibrillation, he is asymptomatic with these episodes.  He has stable nonpitting bilateral lower extremity edema, improved with as needed Lasix.  He notes occasional dizziness, unchanged from prior office visits.  He denies chest pain,  palpitations, dyspnea, pnd, orthopnea, n, v, dizziness, syncope, edema, weight gain, or early satiety. All other systems reviewed and are otherwise negative except as noted above.   Past Medical History    Past Medical History:  Diagnosis Date   CAD (coronary artery disease)    Colon polyps    Diverticulitis    GERD (gastroesophageal reflux disease)    High cholesterol    History of kidney stones 07/01/2022   3 mm   Hypertension    Past Surgical History:  Procedure Laterality Date   ABDOMINAL PERINEAL BOWEL RESECTION     x 2   HERNIA REPAIR     x 6   KNEE ARTHROSCOPY Bilateral    TONSILLECTOMY      Allergies  Allergies  Allergen Reactions   Penicillins Other (See Comments)    Syncope   Shellfish Allergy     Home Medications    Prior to Admission medications   Medication Sig Start Date End Date Taking? Authorizing Provider  AMBULATORY NON FORMULARY MEDICATION Please provide autopap machine with humidifier.  Pressure 6-16 cmH20.  Please provide supplies including mask per patient choice, filters, heated and humidified tubing.  Diagnosis: OSA G47.33 09/21/20   Everrett Coombe, DO  apixaban (ELIQUIS) 5 MG TABS tablet Take 1 tablet (5 mg total) by mouth 2 (two) times daily. 12/22/22   Lewayne Bunting, MD  diltiazem (CARDIZEM CD) 120 MG 24 hr capsule Take 1 capsule (120 mg total) by mouth daily. 09/29/22   Lewayne Bunting, MD  esomeprazole (NEXIUM) 20 MG packet  12/01/13   [provider]  ezetimibe (ZETIA) 10 MG tablet TAKE 1  TABLET BY MOUTH DAILY 03/23/23   Lewayne Bunting, MD  fluticasone-salmeterol [facility] INHUB) 250-50 MCG/ACT AEPB Inhale 1 puff into the lungs in the morning and at bedtime. 07/22/22   Everrett Coombe, DO  furosemide (LASIX) 40 MG tablet Take 40 mg by mouth. As needed.    [provider]  hydrOXYzine (VISTARIL) 25 MG capsule Take 1 capsule (25 mg total) by mouth 2 (two) times daily. 12/09/22   Everrett Coombe, DO  lisinopril (ZESTRIL) 10 MG  tablet Take 1 tablet (10 mg total) by mouth daily. 01/28/23   Lewayne Bunting, MD  Multiple Vitamin (MULTIVITAMIN) capsule Take 1 capsule by mouth daily.    [provider]  Respiratory Therapy Supplies DEVI by Does not apply route.    [provider]  rosuvastatin (CRESTOR) 40 MG tablet Take 1 tablet (40 mg total) by mouth daily. 12/22/22   Everrett Coombe, DO  TADALAFIL PO Take by mouth.    [provider]  zolpidem (AMBIEN) 5 MG tablet Take 1 tablet (5 mg total) by mouth at bedtime. 12/22/22   Everrett Coombe, DO    Physical Exam    Vital Signs:  Terry Acosta does not have vital signs available for review today.  Given telephonic nature of communication, physical exam is limited. AAOx3. NAD. Normal affect.  Speech and respirations are unlabored.  Accessory Clinical Findings    None  Assessment & Plan    1.  Preoperative Cardiovascular Risk Assessment:  According to the Revised Cardiac Risk Index (RCRI), his Perioperative Risk of Major Cardiac Event is (%): 0.9. His Functional Capacity in METs is: 6.36 according to the Duke Activity Status Index (DASI). Therefore, based on ACC/AHA guidelines, patient would be at acceptable risk for the planned procedure without further cardiovascular testing.  The patient was advised that if he develops new symptoms prior to surgery to contact our office to arrange for a follow-up visit, and he verbalized understanding.  Per office protocol, he may hold Eliquis for 2 days prior to procedure as requested. Please resume Eliquis as soon as possible postprocedure, at the discretion of the surgeon.   Patient does not require pre-op antibiotics for dental procedure.   A copy of this note will be routed to requesting surgeon.  Time:   Today, I have spent 7 minutes with the patient with telehealth technology discussing medical history, symptoms, and management plan.     Joylene Grapes, NP  03/25/2023, 9:01 AM

## 2023-04-01 DIAGNOSIS — T84098D Other mechanical complication of other internal joint prosthesis, subsequent encounter: Secondary | ICD-10-CM | POA: Diagnosis not present

## 2023-04-01 DIAGNOSIS — Z96659 Presence of unspecified artificial knee joint: Secondary | ICD-10-CM | POA: Diagnosis not present

## 2023-04-01 DIAGNOSIS — M25552 Pain in left hip: Secondary | ICD-10-CM | POA: Diagnosis not present

## 2023-04-15 DIAGNOSIS — M19072 Primary osteoarthritis, left ankle and foot: Secondary | ICD-10-CM | POA: Diagnosis not present

## 2023-04-15 DIAGNOSIS — Z96652 Presence of left artificial knee joint: Secondary | ICD-10-CM | POA: Diagnosis not present

## 2023-04-15 DIAGNOSIS — T84098D Other mechanical complication of other internal joint prosthesis, subsequent encounter: Secondary | ICD-10-CM | POA: Diagnosis not present

## 2023-04-15 DIAGNOSIS — M19071 Primary osteoarthritis, right ankle and foot: Secondary | ICD-10-CM | POA: Diagnosis not present

## 2023-04-15 DIAGNOSIS — Z96653 Presence of artificial knee joint, bilateral: Secondary | ICD-10-CM | POA: Diagnosis not present

## 2023-04-15 DIAGNOSIS — X58XXXD Exposure to other specified factors, subsequent encounter: Secondary | ICD-10-CM | POA: Diagnosis not present

## 2023-05-05 ENCOUNTER — Ambulatory Visit: Payer: Medicare Other | Admitting: Internal Medicine

## 2023-05-05 ENCOUNTER — Encounter: Payer: Self-pay | Admitting: Internal Medicine

## 2023-05-05 VITALS — BP 136/72 | HR 86 | Ht 74.0 in | Wt 267.0 lb

## 2023-05-05 DIAGNOSIS — Z7901 Long term (current) use of anticoagulants: Secondary | ICD-10-CM

## 2023-05-05 DIAGNOSIS — Z86010 Personal history of colonic polyps: Secondary | ICD-10-CM

## 2023-05-05 MED ORDER — NA SULFATE-K SULFATE-MG SULF 17.5-3.13-1.6 GM/177ML PO SOLN: 1.0000 | Freq: Once | ORAL | 0 refills | Status: AC

## 2023-05-05 NOTE — Patient Instructions (Addendum)
You have been scheduled for a colonoscopy. Please follow written instructions given to you at your visit today.  Please pick up your prep supplies at the pharmacy within the next 1-3 days. If you use inhalers (even only as needed), please bring them with you on the day of your procedure.  _______________________________________________________  If your blood pressure at your visit was 140/90 or greater, please contact your primary care physician to follow up on this.  _______________________________________________________  If you are age 74 or older, your body mass index should be between 23-30. Your Body mass index is 34.28 kg/m. If this is out of the aforementioned range listed, please consider follow up with your Primary Care Provider.  If you are age 79 or younger, your body mass index should be between 19-25. Your Body mass index is 34.28 kg/m. If this is out of the aformentioned range listed, please consider follow up with your Primary Care Provider.   ________________________________________________________  The [place] [facility] providers would like to encourage you to use [facility] to communicate with providers for non-urgent requests or questions.  Due to long hold times on the telephone, sending your provider a message by [facility] may be a faster and more efficient way to get a response.  Please allow 48 business hours for a response.  Please remember that this is for non-urgent requests.  _______________________________________________________

## 2023-05-05 NOTE — Progress Notes (Signed)
HISTORY OF PRESENT ILLNESS:  Terry Acosta is a 72 y.o. male, native of [place], with multiple medical problems including hypertension, coronary artery disease, atrial fibrillation chronic Eliquis therapy.  He is sent today by his primary care provider regarding the need for surveillance colonoscopy.  Patient has a history of perforated diverticulitis for which she underwent temporary colostomy and subsequent reversal 20+ years ago.  He tells me that he has had several colonoscopies and has a history of colon polyps.  I do have the most recent colonoscopy report from [name], Dr. [name], dated July 30, 2016.  At that time he was found to have a sessile polyp ranging between 5 and 9 mm in the transverse colon.  This was removed and found to be hyperplastic.  Follow-up in 5 years recommended.  Patient tells me that close have a history of GERD for which she is on Nexium.  Good control of symptoms.  No dysphagia.  GI review of systems is otherwise negative.  He is anticipating a tooth implant for which he recently had formal cardiology clearance regarding his anticoagulation.  He was cleared to have his Eliquis held for 2 days preprocedure with resumption per the surgeon post procedure.  Review of blood work from February 2024 shows normal hemoglobin 14.0  REVIEW OF SYSTEMS:  All non-GI ROS negative as otherwise stated in the HPI except for arthritis  Past Medical History:  Diagnosis Date   CAD (coronary artery disease)    Colon polyps    Diverticulitis    GERD (gastroesophageal reflux disease)    High cholesterol    History of kidney stones 07/01/2022   3 mm   Hypertension     Past Surgical History:  Procedure Laterality Date   ABDOMINAL PERINEAL BOWEL RESECTION     x 2   HERNIA REPAIR     x 6   KNEE ARTHROSCOPY Bilateral    TONSILLECTOMY      Social History [name]  reports that he has never smoked. He has never used smokeless tobacco. He reports  current alcohol use of about 5.0 standard drinks of alcohol per week. He reports that he does not use drugs.  family history includes Hypertension in his father; Prostate cancer in his father.  Allergies  Allergen Reactions   Penicillins Other (See Comments)    Syncope   Shellfish Allergy        PHYSICAL EXAMINATION: Vital signs: BP 136/72   Pulse 86   Ht 6\' 2"  (1.88 m)   Wt 267 lb (121.1 kg)   SpO2 98%   BMI 34.28 kg/m   Constitutional: generally well-appearing, no acute distress Psychiatric: alert and oriented x3, cooperative Eyes: extraocular movements intact, anicteric, conjunctiva pink Mouth: oral pharynx moist, no lesions Neck: supple no lymphadenopathy Cardiovascular: heart regular rate and rhythm, no murmur Lungs: clear to auscultation bilaterally Abdomen: soft, nontender, nondistended, no obvious ascites, no peritoneal signs, normal bowel sounds, no organomegaly Rectal: Deferred until colonoscopy Extremities: no clubbing or cyanosis.  1+ lower extremity edema bilaterally Skin: no lesions on visible extremities Neuro: No focal deficits.  Cranial nerves intact  ASSESSMENT:  1.  Personal history of colon polyps.  Last examination 2017 with proximal hyperplastic colon polyp greater than 5 mm.  Due for surveillance. 2.  History of perforated diverticulitis with surgery as described. 3.  Multiple medical problems including history of atrial arrhythmia on chronic anticoagulation   PLAN:  1.  Surveillance colonoscopy.  The patient is higher than baseline  risk due to his age and comorbidities and the need to address his anticoagulation.The nature of the procedure, as well as the risks, benefits, and alternatives were carefully and thoroughly reviewed with the patient. Ample time for discussion and questions allowed. The patient understood, was satisfied, and agreed to proceed. 2.  Hold Eliquis 2 days prior to the procedure has recently been acceptable by  cardiology.

## 2023-05-18 ENCOUNTER — Encounter: Payer: Self-pay | Admitting: Internal Medicine

## 2023-05-27 ENCOUNTER — Telehealth: Payer: Self-pay | Admitting: Internal Medicine

## 2023-05-27 ENCOUNTER — Encounter: Payer: Self-pay | Admitting: Cardiology

## 2023-05-27 NOTE — Telephone Encounter (Signed)
Inbound call from patient insurance requesting the patient to receive a call back to discuss out of pocket cost for upcoming colonoscopy. Please advise, thank you.

## 2023-06-01 NOTE — Telephone Encounter (Signed)
Left msg on pt's vm to call back 

## 2023-06-02 ENCOUNTER — Ambulatory Visit (AMBULATORY_SURGERY_CENTER): Payer: Medicare Other | Admitting: Internal Medicine

## 2023-06-02 ENCOUNTER — Encounter: Payer: Self-pay | Admitting: Internal Medicine

## 2023-06-02 VITALS — BP 115/62 | HR 64 | Temp 97.8°F | Resp 12 | Ht 74.0 in | Wt 267.0 lb

## 2023-06-02 DIAGNOSIS — Z09 Encounter for follow-up examination after completed treatment for conditions other than malignant neoplasm: Secondary | ICD-10-CM | POA: Diagnosis not present

## 2023-06-02 DIAGNOSIS — D120 Benign neoplasm of cecum: Secondary | ICD-10-CM | POA: Diagnosis not present

## 2023-06-02 DIAGNOSIS — Z86010 Personal history of colonic polyps: Secondary | ICD-10-CM

## 2023-06-02 DIAGNOSIS — D123 Benign neoplasm of transverse colon: Secondary | ICD-10-CM

## 2023-06-02 MED ORDER — SODIUM CHLORIDE 0.9 % IV SOLN
500.0000 mL | Freq: Once | INTRAVENOUS | Status: DC
Start: 2023-06-02 — End: 2023-06-02

## 2023-06-02 NOTE — Op Note (Signed)
[facility] Patient Name: Terry Acosta Procedure Date: 06/02/2023 2:59 PM MRN: [id number] Endoscopist: Wilhemina Bonito. Marina Goodell , MD, [id number] Age: 72 Referring MD:  Date of Birth: 1951-03-21 Gender: Male Account #: [id number] Procedure:                Colonoscopy with cold snare polypectomy x 3 Indications:              High risk colon cancer surveillance: Personal                            history of colonic polyps. Greater than 5 mm                            sessile hyperplastic polyp in the proximal colon.                            Performed elsewhere 2017 Medicines:                Monitored Anesthesia Care Procedure:                Pre-Anesthesia Assessment:                           - Prior to the procedure, a History and Physical                            was performed, and patient medications and                            allergies were reviewed. The patient's tolerance of                            previous anesthesia was also reviewed. The risks                            and benefits of the procedure and the sedation                            options and risks were discussed with the patient.                            All questions were answered, and informed consent                            was obtained. Prior Anticoagulants: The patient has                            taken Eliquis (apixaban), last dose was 2 days                            prior to procedure. ASA Grade Assessment: III - A                            patient with severe systemic disease. After  reviewing the risks and benefits, the patient was                            deemed in satisfactory condition to undergo the                            procedure.                           After obtaining informed consent, the colonoscope                            was passed under direct vision. Throughout the                            procedure, the patient's blood pressure,  pulse, and                            oxygen saturations were monitored continuously. The                            CF [id number] #[id number] was introduced through the anus                            and advanced to the the cecum, identified by                            appendiceal orifice and ileocecal valve. The                            ileocecal valve, appendiceal orifice, and rectum                            were photographed. The quality of the bowel                            preparation was excellent. The colonoscopy was                            performed without difficulty. The patient tolerated                            the procedure well. The bowel preparation used was                            SUPREP via split dose instruction. Scope In: 3:12:36 PM Scope Out: 3:28:21 PM Scope Withdrawal Time: 0 hours 12 minutes 38 seconds  Total Procedure Duration: 0 hours 15 minutes 45 seconds  Findings:                 Three polyps were found in the transverse colon and                            cecum. The polyps were 1 to 3 mm in size. These  polyps were removed with a cold snare. Resection                            and retrieval were complete.                           Many diverticula were found in the entire colon.                            Internal hemorrhoids.                           The exam was otherwise without abnormality on                            direct and retroflexion views. Complications:            No immediate complications. Estimated blood loss:                            None. Estimated Blood Loss:     Estimated blood loss: none. Impression:               - Three 1 to 3 mm polyps in the transverse colon                            and in the cecum, removed with a cold snare.                            Resected and retrieved.                           - Diverticulosis in the entire examined colon.                            Internal  hemorrhoids.                           - The examination was otherwise normal on direct                            and retroflexion views. Recommendation:           - Repeat colonoscopy is not recommended for                            surveillance.                           - Resume Eliquis (apixaban) tomorrow at prior dose.                           - Patient has a contact number available for                            emergencies. The signs and symptoms of potential  delayed complications were discussed with the                            patient. Return to normal activities tomorrow.                            Written discharge instructions were provided to the                            patient.                           - Resume previous diet.                           - Continue present medications.                           - Await pathology results. Wilhemina Bonito. Marina Goodell, MD 06/02/2023 3:37:57 PM This report has been signed electronically.

## 2023-06-02 NOTE — Patient Instructions (Signed)
Await pathology results.  Resume Eliquis tomorrow at your prior dose.  Resume previous diet.  Continue present medications.  Handouts on polyps, diverticulosis, and hemorrhoids provided.  YOU HAD AN ENDOSCOPIC PROCEDURE TODAY AT THE [facility]:   Refer to the procedure report that was given to you for any specific questions about what was found during the examination.  If the procedure report does not answer your questions, please call your gastroenterologist to clarify.  If you requested that your care partner not be given the details of your procedure findings, then the procedure report has been included in a sealed envelope for you to review at your convenience later.  YOU SHOULD EXPECT: Some feelings of bloating in the abdomen. Passage of more gas than usual.  Walking can help get rid of the air that was put into your GI tract during the procedure and reduce the bloating. If you had a lower endoscopy (such as a colonoscopy or flexible sigmoidoscopy) you may notice spotting of blood in your stool or on the toilet paper. If you underwent a bowel prep for your procedure, you may not have a normal bowel movement for a few days.  Please Note:  You might notice some irritation and congestion in your nose or some drainage.  This is from the oxygen used during your procedure.  There is no need for concern and it should clear up in a day or so.  SYMPTOMS TO REPORT IMMEDIATELY:  Following lower endoscopy (colonoscopy or flexible sigmoidoscopy):  Excessive amounts of blood in the stool  Significant tenderness or worsening of abdominal pains  Swelling of the abdomen that is new, acute  Fever of 100F or higher   For urgent or emergent issues, a gastroenterologist can be reached at any hour by calling [phone]. Do not use MyChart messaging for urgent concerns.    DIET:  We do recommend a small meal at first, but then you may proceed to your regular diet.  Drink plenty of  fluids but you should avoid alcoholic beverages for 24 hours.  ACTIVITY:  You should plan to take it easy for the rest of today and you should NOT DRIVE or use heavy machinery until tomorrow (because of the sedation medicines used during the test).    FOLLOW UP: Our staff will call the number listed on your records the next business day following your procedure.  We will call around 7:15- 8:00 am to check on you and address any questions or concerns that you may have regarding the information given to you following your procedure. If we do not reach you, we will leave a message.     If any biopsies were taken you will be contacted by phone or by letter within the next 1-3 weeks.  Please call us at [phone] if you have not heard about the biopsies in 3 weeks.    SIGNATURES/CONFIDENTIALITY: You and/or your care partner have signed paperwork which will be entered into your electronic medical record.  These signatures attest to the fact that that the information above on your After Visit Summary has been reviewed and is understood.  Full responsibility of the confidentiality of this discharge information lies with you and/or your care-partner.

## 2023-06-02 NOTE — Progress Notes (Signed)
HISTORY OF PRESENT ILLNESS:  Terry Acosta is a 72 y.o. male, native of [place], with multiple medical problems including hypertension, coronary artery disease, atrial fibrillation chronic Eliquis therapy.  He is sent today by his primary care provider regarding the need for surveillance colonoscopy.  Patient has a history of perforated diverticulitis for which she underwent temporary colostomy and subsequent reversal 20+ years ago.  He tells me that he has had several colonoscopies and has a history of colon polyps.  I do have the most recent colonoscopy report from [facility], Dr. Michael Morgan, dated July 30, 2016.  At that time he was found to have a sessile polyp ranging between 5 and 9 mm in the transverse colon.  This was removed and found to be hyperplastic.  Follow-up in 5 years recommended.  Patient tells me that close have a history of GERD for which she is on Nexium.  Good control of symptoms.  No dysphagia.  GI review of systems is otherwise negative.  He is anticipating a tooth implant for which he recently had formal cardiology clearance regarding his anticoagulation.  He was cleared to have his Eliquis held for 2 days preprocedure with resumption per the surgeon post procedure.  Review of blood work from February 2024 shows normal hemoglobin 14.0  REVIEW OF SYSTEMS:  All non-GI ROS negative as otherwise stated in the HPI except for arthritis  Past Medical History:  Diagnosis Date   CAD (coronary artery disease)    Colon polyps    Diverticulitis    GERD (gastroesophageal reflux disease)    High cholesterol    History of kidney stones 07/01/2022   3 mm   Hypertension     Past Surgical History:  Procedure Laterality Date   ABDOMINAL PERINEAL BOWEL RESECTION     x 2   HERNIA REPAIR     x 6   KNEE ARTHROSCOPY Bilateral    TONSILLECTOMY      Social History Terry Acosta  reports that he has never smoked. He has never used smokeless tobacco. He reports  current alcohol use of about 5.0 standard drinks of alcohol per week. He reports that he does not use drugs.  family history includes Hypertension in his father; Prostate cancer in his father.  Allergies  Allergen Reactions   Penicillins Other (See Comments)    Syncope   Shellfish Allergy        PHYSICAL EXAMINATION: Vital signs: BP 136/72   Pulse 86   Ht 6' 2" (1.88 m)   Wt 267 lb (121.1 kg)   SpO2 98%   BMI 34.28 kg/m   Constitutional: generally well-appearing, no acute distress Psychiatric: alert and oriented x3, cooperative Eyes: extraocular movements intact, anicteric, conjunctiva pink Mouth: oral pharynx moist, no lesions Neck: supple no lymphadenopathy Cardiovascular: heart regular rate and rhythm, no murmur Lungs: clear to auscultation bilaterally Abdomen: soft, nontender, nondistended, no obvious ascites, no peritoneal signs, normal bowel sounds, no organomegaly Rectal: Deferred until colonoscopy Extremities: no clubbing or cyanosis.  1+ lower extremity edema bilaterally Skin: no lesions on visible extremities Neuro: No focal deficits.  Cranial nerves intact  ASSESSMENT:  1.  Personal history of colon polyps.  Last examination 2017 with proximal hyperplastic colon polyp greater than 5 mm.  Due for surveillance. 2.  History of perforated diverticulitis with surgery as described. 3.  Multiple medical problems including history of atrial arrhythmia on chronic anticoagulation   PLAN:  1.  Surveillance colonoscopy.  The patient is higher than baseline   risk due to his age and comorbidities and the need to address his anticoagulation.The nature of the procedure, as well as the risks, benefits, and alternatives were carefully and thoroughly reviewed with the patient. Ample time for discussion and questions allowed. The patient understood, was satisfied, and agreed to proceed. 2.  Hold Eliquis 2 days prior to the procedure has recently been acceptable by  cardiology.      

## 2023-06-02 NOTE — Progress Notes (Signed)
Report to PACU, RN, vss, BBS= Clear.  

## 2023-06-02 NOTE — Progress Notes (Signed)
Called to room to assist during endoscopic procedure.  Patient ID and intended procedure confirmed with present staff. Received instructions for my participation in the procedure from the performing physician.  

## 2023-06-03 ENCOUNTER — Telehealth: Payer: Self-pay | Admitting: *Deleted

## 2023-06-03 NOTE — Telephone Encounter (Signed)
  Follow up Call-     06/02/2023    2:41 PM  Call back number  Post procedure Call Back phone  # [phone]  Permission to leave phone message Yes     Patient questions:  Do you have a fever, pain , or abdominal swelling? No. Pain Score  0 *  Have you tolerated food without any problems? Yes.    Have you been able to return to your normal activities? Yes.    Do you have any questions about your discharge instructions: Diet   No. Medications  No. Follow up visit  No.  Do you have questions or concerns about your Care? No.  Actions: * If pain score is 4 or above: No action needed, pain <4.

## 2023-06-05 ENCOUNTER — Other Ambulatory Visit: Payer: Self-pay | Admitting: Family Medicine

## 2023-06-05 NOTE — Telephone Encounter (Signed)
Last OV: 01/22/23 Next OV: 07/23/23 Last RF: 12/22/22

## 2023-06-09 ENCOUNTER — Encounter: Payer: Self-pay | Admitting: Internal Medicine

## 2023-06-09 DIAGNOSIS — G4733 Obstructive sleep apnea (adult) (pediatric): Secondary | ICD-10-CM | POA: Diagnosis not present

## 2023-06-15 ENCOUNTER — Encounter: Payer: Self-pay | Admitting: Cardiology

## 2023-06-15 ENCOUNTER — Other Ambulatory Visit: Payer: Self-pay | Admitting: Cardiology

## 2023-06-15 DIAGNOSIS — E7800 Pure hypercholesterolemia, unspecified: Secondary | ICD-10-CM

## 2023-06-17 ENCOUNTER — Encounter: Payer: Self-pay | Admitting: Cardiology

## 2023-07-07 NOTE — Progress Notes (Signed)
HPI: FU CAD. Echocardiogram 2017 showed normal LV function and mild left ventricular hypertrophy.  Abdominal ultrasound February 2019 showed no aneurysm.  Cardiac catheterization March 2019 showed less than 50% involving the mid right coronary artery and proximal LAD.  Monitor April 2023 showed sinus rhythm with paroxysmal atrial fibrillation, PVCs and 6 beats nonsustained ventricular tachycardia. Since last seen, he has dyspnea with more vigorous activities but not routine activities.  No orthopnea, PND, chest pain or syncope.  He feels as though he may be going in and out of atrial fibrillation.  He did bring strips from his [employer] which shows sinus rhythm with PACs.  Current Outpatient Medications  Medication Sig Dispense Refill   AMBULATORY NON FORMULARY MEDICATION Please provide autopap machine with humidifier.  Pressure 6-16 cmH20.  Please provide supplies including mask per patient choice, filters, heated and humidified tubing.  Diagnosis: OSA G47.33 1 Device 0   apixaban (ELIQUIS) 5 MG TABS tablet Take 1 tablet (5 mg total) by mouth 2 (two) times daily. 180 tablet 1   diltiazem (CARDIZEM CD) 120 MG 24 hr capsule Take 1 capsule (120 mg total) by mouth daily. 90 capsule 3   esomeprazole (NEXIUM) 20 MG packet      ezetimibe (ZETIA) 10 MG tablet TAKE 1 TABLET BY MOUTH DAILY 90 tablet 0   fluticasone-salmeterol (WIXELA INHUB) 250-50 MCG/ACT AEPB Inhale 1 puff into the lungs in the morning and at bedtime. 60 each 3   furosemide (LASIX) 40 MG tablet Take 40 mg by mouth. As needed.     hydrOXYzine (VISTARIL) 25 MG capsule Take 1 capsule (25 mg total) by mouth 2 (two) times daily. 180 capsule 3   lisinopril (ZESTRIL) 10 MG tablet Take 1 tablet (10 mg total) by mouth daily. 90 tablet 1   Multiple Vitamin (MULTIVITAMIN) capsule Take 1 capsule by mouth daily.     Respiratory Therapy Supplies DEVI by Does not apply route.     rosuvastatin (CRESTOR) 40 MG tablet Take 1 tablet (40 mg total) by  mouth daily. 90 tablet 3   TADALAFIL PO Take by mouth.     zolpidem (AMBIEN) 5 MG tablet TAKE 1 TABLET BY MOUTH AT BEDTIME 90 tablet 1   No current facility-administered medications for this visit.     Past Medical History:  Diagnosis Date   CAD (coronary artery disease)    Colon polyps    Diverticulitis    GERD (gastroesophageal reflux disease)    High cholesterol    History of kidney stones 07/01/2022   3 mm   Hypertension     Past Surgical History:  Procedure Laterality Date   ABDOMINAL PERINEAL BOWEL RESECTION     x 2   HERNIA REPAIR     x 6   KNEE ARTHROSCOPY Bilateral    TONSILLECTOMY      Social History   Socioeconomic History   Marital status: Married    Spouse name: Tyjai Armenia   Number of children: 2   Years of education: 16   Highest education level: Bachelor's degree (e.g., BA, AB, BS)  Occupational History   Occupation: Self Employed   Occupation: Retired  Tobacco Use   Smoking status: Never   Smokeless tobacco: Never  Vaping Use   Vaping status: Never Used  Substance and Sexual Activity   Alcohol use: Yes    Alcohol/week: 5.0 standard drinks of alcohol    Types: 5 Standard drinks or equivalent per week   Drug use: Never  Sexual activity: Yes    Partners: Female  Other Topics Concern   Not on file  Social History Narrative   Lives with his wife. He enjoys photography, music and volunteering.   Social Determinants of Health   Financial Resource Strain: Low Risk  (10/03/2022)   Overall Financial Resource Strain (CARDIA)    Difficulty of Paying Living Expenses: Not hard at all  Food Insecurity: No Food Insecurity (10/03/2022)   Hunger Vital Sign    Worried About Running Out of Food in the Last Year: Never true    Ran Out of Food in the Last Year: Never true  Transportation Needs: No Transportation Needs (10/03/2022)   PRAPARE - Administrator, Civil Service (Medical): No    Lack of Transportation (Non-Medical): No   Physical Activity: Sufficiently Active (10/03/2022)   Exercise Vital Sign    Days of Exercise per Week: 6 days    Minutes of Exercise per Session: 60 min  Stress: No Stress Concern Present (10/03/2022)   [employer] of Occupational Health - Occupational Stress Questionnaire    Feeling of Stress : Not at all  Social Connections: Unknown (05/30/2023)   Received from [facility], [facility]   Social Network    Social Network: Not on file  Intimate Partner Violence: Unknown (05/30/2023)   Received from [facility], [facility]   HITS    Physically Hurt: Not on file    Insult or Talk Down To: Not on file    Threaten Physical Harm: Not on file    Scream or Curse: Not on file    Family History  Problem Relation Age of Onset   Hypertension Father    Prostate cancer Father    Colon cancer Neg Hx    Stomach cancer Neg Hx    Esophageal cancer Neg Hx    Pancreatic cancer Neg Hx     ROS: no fevers or chills, productive cough, hemoptysis, dysphasia, odynophagia, melena, hematochezia, dysuria, hematuria, rash, seizure activity, orthopnea, PND, pedal edema, claudication. Remaining systems are negative.  Physical Exam: Well-developed well-nourished in no acute distress.  Skin is warm and dry.  HEENT is normal.  Neck is supple.  Chest is clear to auscultation with normal expansion.  Cardiovascular exam is regular rate and rhythm.  Abdominal exam nontender or distended. No masses palpated. Extremities show no edema. neuro grossly intact  EKG Interpretation Date/Time:  Monday July 13 2023 11:48:17 EDT Ventricular Rate:  87 PR Interval:  160 QRS Duration:  86 QT Interval:  366 QTC Calculation: 440 R Axis:   -9  Text Interpretation: Normal sinus rhythm Low voltage QRS Cannot rule out Anterior infarct , age undetermined No previous ECGs available Confirmed by Olga Millers ([id number]) on 07/13/2023 11:52:47 AM  Unchanged compared to March 25, 2022.  A/P  1 paroxysmal  atrial fibrillation-patient is in sinus rhythm today.  Continue apixaban and Cardizem.  He feels as though he may be having increased frequency of atrial fibrillation.  Have asked him to forward any strips when he is having palpitations so that I can confirm this.  If so we could consider ablation or an antiarrhythmic such as Tikosyn or amiodarone.  2 hypertension-patient's blood pressure is controlled.  Continue present medical regimen.  3 hyperlipidemia-continue statin.  4 coronary artery disease-no chest pain.  Continue statin.  He is not on aspirin given need for anticoagulation.  Olga Millers, MD

## 2023-07-13 ENCOUNTER — Encounter: Payer: Self-pay | Admitting: Cardiology

## 2023-07-13 ENCOUNTER — Ambulatory Visit: Payer: Medicare Other | Admitting: Cardiology

## 2023-07-13 VITALS — BP 139/81 | HR 87 | Ht 74.0 in | Wt 267.4 lb

## 2023-07-13 DIAGNOSIS — R002 Palpitations: Secondary | ICD-10-CM | POA: Diagnosis not present

## 2023-07-13 DIAGNOSIS — I480 Paroxysmal atrial fibrillation: Secondary | ICD-10-CM

## 2023-07-13 DIAGNOSIS — I10 Essential (primary) hypertension: Secondary | ICD-10-CM

## 2023-07-13 MED ORDER — APIXABAN 5 MG PO TABS: 5.0000 mg | ORAL_TABLET | Freq: Two times a day (BID) | ORAL | 1 refills | Status: DC

## 2023-07-13 NOTE — Patient Instructions (Signed)

## 2023-07-15 ENCOUNTER — Encounter: Payer: Self-pay | Admitting: Family Medicine

## 2023-07-15 MED ORDER — HYDROXYZINE PAMOATE 25 MG PO CAPS: 25.0000 mg | ORAL_CAPSULE | Freq: Two times a day (BID) | ORAL | 3 refills | Status: DC

## 2023-07-16 ENCOUNTER — Encounter: Payer: Self-pay | Admitting: Cardiology

## 2023-07-23 ENCOUNTER — Ambulatory Visit (INDEPENDENT_AMBULATORY_CARE_PROVIDER_SITE_OTHER): Payer: Medicare Other

## 2023-07-23 ENCOUNTER — Encounter: Payer: Self-pay | Admitting: Family Medicine

## 2023-07-23 ENCOUNTER — Ambulatory Visit (INDEPENDENT_AMBULATORY_CARE_PROVIDER_SITE_OTHER): Payer: Medicare Other | Admitting: Family Medicine

## 2023-07-23 VITALS — BP 132/70 | HR 80 | Temp 98.5°F | Resp 16 | Ht 74.0 in | Wt 265.0 lb

## 2023-07-23 DIAGNOSIS — R7309 Other abnormal glucose: Secondary | ICD-10-CM | POA: Diagnosis not present

## 2023-07-23 DIAGNOSIS — M25572 Pain in left ankle and joints of left foot: Secondary | ICD-10-CM | POA: Diagnosis not present

## 2023-07-23 DIAGNOSIS — M25472 Effusion, left ankle: Secondary | ICD-10-CM

## 2023-07-23 DIAGNOSIS — G8929 Other chronic pain: Secondary | ICD-10-CM | POA: Diagnosis not present

## 2023-07-23 DIAGNOSIS — E7800 Pure hypercholesterolemia, unspecified: Secondary | ICD-10-CM

## 2023-07-23 DIAGNOSIS — M7732 Calcaneal spur, left foot: Secondary | ICD-10-CM | POA: Diagnosis not present

## 2023-07-23 DIAGNOSIS — I10 Essential (primary) hypertension: Secondary | ICD-10-CM | POA: Diagnosis not present

## 2023-07-23 DIAGNOSIS — I2510 Atherosclerotic heart disease of native coronary artery without angina pectoris: Secondary | ICD-10-CM | POA: Diagnosis not present

## 2023-07-23 DIAGNOSIS — I480 Paroxysmal atrial fibrillation: Secondary | ICD-10-CM

## 2023-07-23 DIAGNOSIS — F5101 Primary insomnia: Secondary | ICD-10-CM

## 2023-07-23 DIAGNOSIS — M7989 Other specified soft tissue disorders: Secondary | ICD-10-CM | POA: Diagnosis not present

## 2023-07-23 DIAGNOSIS — M19072 Primary osteoarthritis, left ankle and foot: Secondary | ICD-10-CM | POA: Diagnosis not present

## 2023-07-23 NOTE — Assessment & Plan Note (Signed)
Continue with ambien as needed.  No adverse side effects at this time.

## 2023-07-23 NOTE — Assessment & Plan Note (Signed)
Management per cardiology.  Remains rate controlled with diltiazem.  Anticoagulated with eliquis and tolerating well.  Update renal function and cbc

## 2023-07-23 NOTE — Assessment & Plan Note (Signed)
Doing well with crestor and zetia at current strength.  Lipids are at goal. Repeat lipid panel today.

## 2023-07-23 NOTE — Assessment & Plan Note (Signed)
BP is well controlled at this time.  Continue current medications for management of HTN.  Updated labs ordered today.

## 2023-07-23 NOTE — Assessment & Plan Note (Signed)
Xrays of L ankle ordered.

## 2023-07-23 NOTE — Assessment & Plan Note (Signed)
Checking A1c today.

## 2023-07-23 NOTE — Progress Notes (Signed)
Terry Acosta - 72 y.o. male MRN [id number]  Date of birth: [birth year]  Subjective Chief Complaint  Patient presents with   Medical Management of Chronic Issues    6 mo fup Htn, ankle swelling x 1 yr and nasal drainage for a few days    HPI Terry Acosta is a 72 y.o. male here today for follow up visit.   He reports that he is doing well.  He has had some congestion and post nasal drainage for a few days.   History of HTN and PAF.  Followed by cardiology.  Continueson diltiazem and lisinopril.  Rate is well managed at this time.  He has furosemide as needed.  Has noted some mild swelling of his ankles.  He is anticoagulated with eliquis and tolerating well.   HLD is managed with crestor and zetia.  Tolerating these well at this time.   Ambien as needed for insomnia which continues to work well for him.     ROS:  A comprehensive ROS was completed and negative except as noted per HPI  Allergies  Allergen Reactions   Penicillins Other (See Comments)    Syncope   Shellfish Allergy     Past Medical History:  Diagnosis Date   CAD (coronary artery disease)    Colon polyps    Diverticulitis    GERD (gastroesophageal reflux disease)    High cholesterol    History of kidney stones 07/01/2022   3 mm   Hypertension     Past Surgical History:  Procedure Laterality Date   ABDOMINAL PERINEAL BOWEL RESECTION     x 2   HERNIA REPAIR     x 6   KNEE ARTHROSCOPY Bilateral    TONSILLECTOMY      Social History   Socioeconomic History   Marital status: Married    Spouse name: Marguis Durrer   Number of children: 2   Years of education: 16   Highest education level: Bachelor's degree (e.g., BA, AB, BS)  Occupational History   Occupation: Self Employed   Occupation: Retired  Tobacco Use   Smoking status: Never   Smokeless tobacco: Never  Vaping Use   Vaping status: Never Used  Substance and Sexual Activity   Alcohol use: Yes    Alcohol/week: 5.0 standard drinks  of alcohol    Types: 5 Standard drinks or equivalent per week   Drug use: Never   Sexual activity: Yes    Partners: Female  Other Topics Concern   Not on file  Social History Narrative   Lives with his wife. He enjoys photography, music and volunteering.   Social Determinants of Health   Financial Resource Strain: Low Risk  (10/03/2022)   Overall Financial Resource Strain (CARDIA)    Difficulty of Paying Living Expenses: Not hard at all  Food Insecurity: No Food Insecurity (10/03/2022)   Hunger Vital Sign    Worried About Running Out of Food in the Last Year: Never true    Ran Out of Food in the Last Year: Never true  Transportation Needs: No Transportation Needs (10/03/2022)   PRAPARE - Administrator, Civil Service (Medical): No    Lack of Transportation (Non-Medical): No  Physical Activity: Sufficiently Active (10/03/2022)   Exercise Vital Sign    Days of Exercise per Week: 6 days    Minutes of Exercise per Session: 60 min  Stress: No Stress Concern Present (10/03/2022)   [employer] of Occupational Health - Occupational Stress Questionnaire  Feeling of Stress : Not at all  Social Connections: Unknown (05/30/2023)   Received from [facility], [facility]   Social Network    Social Network: Not on file    Family History  Problem Relation Age of Onset   Hypertension Father    Prostate cancer Father    Colon cancer Neg Hx    Stomach cancer Neg Hx    Esophageal cancer Neg Hx    Pancreatic cancer Neg Hx     Health Maintenance  Topic Date Due   Hepatitis C Screening  10/04/2023 (Originally 02/07/1969)   COVID-19 Vaccine (4 - 2023-24 season) 02/07/2024 (Originally 08/01/2022)   INFLUENZA VACCINE  02/29/2024 (Originally 07/02/2023)   Medicare Annual Wellness (AWV)  10/04/2023   DTaP/Tdap/Td (2 - Tdap) 12/16/2023   Colonoscopy  06/01/2028   Pneumonia Vaccine 63+ Years old  Completed   Zoster Vaccines- Shingrix  Completed   HPV VACCINES  Aged Out      ----------------------------------------------------------------------------------------------------------------------------------------------------------------------------------------------------------------- Physical Exam BP 132/70   Pulse 80   Temp 98.5 F (36.9 C) (Oral)   Resp 16   Ht 6\' 2"  (1.88 m)   Wt 265 lb (120.2 kg)   SpO2 98%   BMI 34.02 kg/m   Physical Exam Constitutional:      Appearance: Normal appearance.  Eyes:     General: No scleral icterus. Cardiovascular:     Rate and Rhythm: Normal rate and regular rhythm.  Pulmonary:     Effort: Pulmonary effort is normal.     Breath sounds: Normal breath sounds.  Neurological:     Mental Status: He is alert.  Psychiatric:        Mood and Affect: Mood normal.        Behavior: Behavior normal.     ------------------------------------------------------------------------------------------------------------------------------------------------------------------------------------------------------------------- Assessment and Plan  Essential hypertension BP is well controlled at this time.  Continue current medications for management of HTN.  Updated labs ordered today.   Hyperlipidemia Doing well with crestor and zetia at current strength.  Lipids are at goal. Repeat lipid panel today.   Insomnia Continue with ambien as needed.  No adverse side effects at this time.   PAF (paroxysmal atrial fibrillation) (HCC) Management per cardiology.  Remains rate controlled with diltiazem.  Anticoagulated with eliquis and tolerating well.  Update renal function and cbc  Elevated glucose Checking A1c today.   Left ankle swelling Xrays of L ankle ordered.    No orders of the defined types were placed in this encounter.   Return in about 6 months (around 01/23/2024) for HTN/HLD.    This visit occurred during the SARS-CoV-2 public health emergency.  Safety protocols were in place, including screening questions prior  to the visit, additional usage of staff PPE, and extensive cleaning of exam room while observing appropriate contact time as indicated for disinfecting solutions.

## 2023-07-24 LAB — CMP14+EGFR
ALT: 23 IU/L (ref 0–44)
AST: 23 IU/L (ref 0–40)
Albumin: 4.1 g/dL (ref 3.8–4.8)
Alkaline Phosphatase: 79 IU/L (ref 44–121)
BUN/Creatinine Ratio: 12 (ref 10–24)
BUN: 10 mg/dL (ref 8–27)
Bilirubin Total: 0.4 mg/dL (ref 0.0–1.2)
CO2: 24 mmol/L (ref 20–29)
Calcium: 9.1 mg/dL (ref 8.6–10.2)
Chloride: 103 mmol/L (ref 96–106)
Creatinine, Ser: 0.81 mg/dL (ref 0.76–1.27)
Globulin, Total: 2.3 g/dL (ref 1.5–4.5)
Glucose: 111 mg/dL — ABNORMAL HIGH (ref 70–99)
Potassium: 4.2 mmol/L (ref 3.5–5.2)
Sodium: 142 mmol/L (ref 134–144)
Total Protein: 6.4 g/dL (ref 6.0–8.5)
eGFR: 93 mL/min/{1.73_m2} (ref >59)

## 2023-07-24 LAB — LIPID PANEL WITH LDL/HDL RATIO
Cholesterol, Total: 125 mg/dL (ref 100–199)
HDL: 48 mg/dL (ref >39)
LDL Chol Calc (NIH): 61 mg/dL (ref 0–99)
LDL/HDL Ratio: 1.3 ratio (ref 0.0–3.6)
Triglycerides: 80 mg/dL (ref 0–149)
VLDL Cholesterol Cal: 16 mg/dL (ref 5–40)

## 2023-07-24 LAB — CBC WITH DIFFERENTIAL/PLATELET
Basophils Absolute: 0.1 10*3/uL (ref 0.0–0.2)
Basos: 2 %
EOS (ABSOLUTE): 0.2 10*3/uL (ref 0.0–0.4)
Eos: 3 %
Hematocrit: 44.4 % (ref 37.5–51.0)
Hemoglobin: 14.4 g/dL (ref 13.0–17.7)
Immature Grans (Abs): 0 10*3/uL (ref 0.0–0.1)
Immature Granulocytes: 0 %
Lymphocytes Absolute: 1.4 10*3/uL (ref 0.7–3.1)
Lymphs: 28 %
MCH: 28.9 pg (ref 26.6–33.0)
MCHC: 32.4 g/dL (ref 31.5–35.7)
MCV: 89 fL (ref 79–97)
Monocytes Absolute: 0.6 10*3/uL (ref 0.1–0.9)
Monocytes: 12 %
Neutrophils Absolute: 2.8 10*3/uL (ref 1.4–7.0)
Neutrophils: 55 %
Platelets: 201 10*3/uL (ref 150–450)
RBC: 4.99 x10E6/uL (ref 4.14–5.80)
RDW: 12.9 % (ref 11.6–15.4)
WBC: 5.1 10*3/uL (ref 3.4–10.8)

## 2023-07-24 LAB — HEMOGLOBIN A1C
Est. average glucose Bld gHb Est-mCnc: 134 mg/dL
Hgb A1c MFr Bld: 6.3 % — ABNORMAL HIGH (ref 4.8–5.6)

## 2023-07-27 ENCOUNTER — Other Ambulatory Visit: Payer: Self-pay | Admitting: Cardiology

## 2023-07-27 DIAGNOSIS — I2510 Atherosclerotic heart disease of native coronary artery without angina pectoris: Secondary | ICD-10-CM

## 2023-09-09 DIAGNOSIS — G4733 Obstructive sleep apnea (adult) (pediatric): Secondary | ICD-10-CM | POA: Diagnosis not present

## 2023-09-11 ENCOUNTER — Other Ambulatory Visit: Payer: Self-pay | Admitting: Cardiology

## 2023-09-11 DIAGNOSIS — E7800 Pure hypercholesterolemia, unspecified: Secondary | ICD-10-CM

## 2023-09-22 ENCOUNTER — Other Ambulatory Visit: Payer: Self-pay | Admitting: Cardiology

## 2023-09-22 DIAGNOSIS — I480 Paroxysmal atrial fibrillation: Secondary | ICD-10-CM

## 2023-10-27 ENCOUNTER — Other Ambulatory Visit: Payer: Self-pay | Admitting: Internal Medicine

## 2023-10-27 ENCOUNTER — Ambulatory Visit (INDEPENDENT_AMBULATORY_CARE_PROVIDER_SITE_OTHER): Payer: Medicare Other

## 2023-10-27 ENCOUNTER — Ambulatory Visit (INDEPENDENT_AMBULATORY_CARE_PROVIDER_SITE_OTHER): Payer: Medicare Other | Admitting: Family Medicine

## 2023-10-27 VITALS — BP 139/71 | HR 88 | Ht 74.0 in | Wt 268.0 lb

## 2023-10-27 DIAGNOSIS — R062 Wheezing: Secondary | ICD-10-CM

## 2023-10-27 DIAGNOSIS — R051 Acute cough: Secondary | ICD-10-CM

## 2023-10-27 DIAGNOSIS — J40 Bronchitis, not specified as acute or chronic: Secondary | ICD-10-CM | POA: Diagnosis not present

## 2023-10-27 DIAGNOSIS — R059 Cough, unspecified: Secondary | ICD-10-CM

## 2023-10-27 MED ORDER — PREDNISONE 20 MG PO TABS: 20.0000 mg | ORAL_TABLET | Freq: Two times a day (BID) | ORAL | 0 refills | Status: DC

## 2023-10-27 MED ORDER — ALBUTEROL SULFATE HFA 108 (90 BASE) MCG/ACT IN AERS: 2.0000 | INHALATION_SPRAY | Freq: Four times a day (QID) | RESPIRATORY_TRACT | 0 refills | Status: DC | PRN

## 2023-10-27 NOTE — Progress Notes (Signed)
Lawrnce Acosta - 71 y.o. male MRN [id number]  Date of birth: 1951/08/23  Subjective Chief Complaint  Patient presents with   URI    HPI Terry Acosta is a 72 y.o. male here today with complaint of chest and sinus congestion.  He has had cough and wheezing with this as well. He denies fever or chills.  Doesn't feel shortness of breath and denies chest pain.  Using OTC decongestant and cough medication.   ROS:  A comprehensive ROS was completed and negative except as noted per HPI  Allergies  Allergen Reactions   Penicillins Other (See Comments)    Syncope   Shellfish Allergy     Past Medical History:  Diagnosis Date   CAD (coronary artery disease)    Colon polyps    Diverticulitis    GERD (gastroesophageal reflux disease)    High cholesterol    History of kidney stones 07/01/2022   3 mm   Hypertension     Past Surgical History:  Procedure Laterality Date   ABDOMINAL PERINEAL BOWEL RESECTION     x 2   HERNIA REPAIR     x 6   KNEE ARTHROSCOPY Bilateral    TONSILLECTOMY      Social History   Socioeconomic History   Marital status: Married    Spouse name: Neamiah Fredericks   Number of children: 2   Years of education: 16   Highest education level: Bachelor's degree (e.g., BA, AB, BS)  Occupational History   Occupation: Self Employed   Occupation: Retired  Tobacco Use   Smoking status: Never   Smokeless tobacco: Never  Vaping Use   Vaping status: Never Used  Substance and Sexual Activity   Alcohol use: Yes    Alcohol/week: 5.0 standard drinks of alcohol    Types: 5 Standard drinks or equivalent per week   Drug use: Never   Sexual activity: Yes    Partners: Female  Other Topics Concern   Not on file  Social History Narrative   Lives with his wife. He enjoys photography, music and volunteering.   Social Determinants of Health   Financial Resource Strain: Low Risk  (10/27/2023)   Overall Financial Resource Strain (CARDIA)    Difficulty of Paying  Living Expenses: Not very hard  Food Insecurity: No Food Insecurity (10/27/2023)   Hunger Vital Sign    Worried About Running Out of Food in the Last Year: Never true    Ran Out of Food in the Last Year: Never true  Transportation Needs: No Transportation Needs (10/27/2023)   PRAPARE - Administrator, Civil Service (Medical): No    Lack of Transportation (Non-Medical): No  Physical Activity: Sufficiently Active (10/27/2023)   Exercise Vital Sign    Days of Exercise per Week: 5 days    Minutes of Exercise per Session: 40 min  Stress: No Stress Concern Present (10/27/2023)   [employer] of Occupational Health - Occupational Stress Questionnaire    Feeling of Stress : Not at all  Social Connections: Socially Integrated (10/27/2023)   Social Connection and Isolation Panel [NHANES]    Frequency of Communication with Friends and Family: More than three times a week    Frequency of Social Gatherings with Friends and Family: Once a week    Attends Religious Services: More than 4 times per year    Active Member of [employer] or Organizations: Yes    Attends Banker Meetings: More than 4 times per year  Marital Status: Married    Family History  Problem Relation Age of Onset   Hypertension Father    Prostate cancer Father    Colon cancer Neg Hx    Stomach cancer Neg Hx    Esophageal cancer Neg Hx    Pancreatic cancer Neg Hx     Health Maintenance  Topic Date Due   Hepatitis C Screening  Never done   COVID-19 Vaccine (4 - 2023-24 season) 08/02/2023   Medicare Annual Wellness (AWV)  10/04/2023   INFLUENZA VACCINE  02/29/2024 (Originally 07/02/2023)   DTaP/Tdap/Td (2 - Tdap) 12/16/2023   Colonoscopy  06/01/2028   Pneumonia Vaccine 34+ Years old  Completed   Zoster Vaccines- Shingrix  Completed   HPV VACCINES  Aged Out      ----------------------------------------------------------------------------------------------------------------------------------------------------------------------------------------------------------------- Physical Exam BP 139/71 (BP Location: Left Arm, Patient Position: Sitting, Cuff Size: Large)   Pulse 88   Ht 6\' 2"  (1.88 m)   Wt 268 lb (121.6 kg)   SpO2 95%   BMI 34.41 kg/m   Physical Exam Constitutional:      Appearance: Normal appearance.  Eyes:     General: No scleral icterus. Cardiovascular:     Rate and Rhythm: Normal rate and regular rhythm.  Pulmonary:     Breath sounds: Wheezing present.     Comments: Diffuse, bilateral.  Neurological:     Mental Status: He is alert.  Psychiatric:        Mood and Affect: Mood normal.        Behavior: Behavior normal.     ------------------------------------------------------------------------------------------------------------------------------------------------------------------------------------------------------------------- Assessment and Plan  Bronchitis Starting burst of prednisone with albuterol PRN. Recommend continued supportive care.  CXR ordered as well to r/o pneumonia.     Meds ordered this encounter  Medications   predniSONE (DELTASONE) 20 MG tablet    Sig: Take 1 tablet (20 mg total) by mouth 2 (two) times daily with a meal.    Dispense:  10 tablet    Refill:  0   albuterol (VENTOLIN HFA) 108 (90 Base) MCG/ACT inhaler    Sig: Inhale 2 puffs into the lungs every 6 (six) hours as needed for wheezing or shortness of breath.    Dispense:  8 g    Refill:  0    No follow-ups on file.    This visit occurred during the SARS-CoV-2 public health emergency.  Safety protocols were in place, including screening questions prior to the visit, additional usage of staff PPE, and extensive cleaning of exam room while observing appropriate contact time as indicated for disinfecting solutions.

## 2023-10-27 NOTE — Assessment & Plan Note (Signed)
Starting burst of prednisone with albuterol PRN. Recommend continued supportive care.  CXR ordered as well to r/o pneumonia.

## 2023-11-04 ENCOUNTER — Encounter: Payer: Self-pay | Admitting: Family Medicine

## 2023-11-05 ENCOUNTER — Other Ambulatory Visit: Payer: Self-pay | Admitting: Family Medicine

## 2023-11-05 MED ORDER — ALBUTEROL SULFATE HFA 108 (90 BASE) MCG/ACT IN AERS: 2.0000 | INHALATION_SPRAY | Freq: Four times a day (QID) | RESPIRATORY_TRACT | 0 refills | Status: AC | PRN

## 2023-11-05 MED ORDER — BENZONATATE 200 MG PO CAPS: 200.0000 mg | ORAL_CAPSULE | Freq: Two times a day (BID) | ORAL | 0 refills | Status: DC | PRN

## 2023-11-23 NOTE — Progress Notes (Signed)
   11/23/2023  Patient ID: Terry Acosta, male   DOB: [birth year], 72 y.o.   MRN: [id number]  Pharmacy Quality Measure Review  This patient is appearing on a report for being at risk of failing the adherence measure for cholesterol (statin) medications this calendar year.   Medication: rosuvastatin 40mg  Last fill date: 11/09/23 for 90 day supply  Insurance report was not up to date. No action needed at this time.   Lenna Gilford, PharmD, DPLA

## 2023-12-07 ENCOUNTER — Other Ambulatory Visit: Payer: Self-pay | Admitting: Family Medicine

## 2023-12-09 ENCOUNTER — Encounter: Payer: Self-pay | Admitting: Family Medicine

## 2023-12-09 ENCOUNTER — Ambulatory Visit (INDEPENDENT_AMBULATORY_CARE_PROVIDER_SITE_OTHER): Payer: PPO | Admitting: Family Medicine

## 2023-12-09 ENCOUNTER — Ambulatory Visit: Payer: Self-pay

## 2023-12-09 VITALS — BP 153/76 | HR 93 | Ht 74.0 in | Wt 267.0 lb

## 2023-12-09 DIAGNOSIS — S6992XA Unspecified injury of left wrist, hand and finger(s), initial encounter: Secondary | ICD-10-CM | POA: Diagnosis not present

## 2023-12-09 NOTE — Progress Notes (Signed)
   Acute Office Visit  Subjective:     Patient ID: Terry Acosta, male    DOB: 1951/01/28, 73 y.o.   MRN: [id number]  Chief Complaint  Patient presents with   Finger Injury    HPI Patient is in today for finger injury. He jammed his finger while photographing a basketball game. He was holding his camera and the ball hit his finger. Left pointer finger is painful.   Review of Systems  Constitutional:  Negative for chills and fever.  Respiratory:  Negative for cough and shortness of breath.   Cardiovascular:  Negative for chest pain.  Musculoskeletal:        Finger pain  Neurological:  Negative for headaches.        Objective:    BP (!) 153/76 (BP Location: Left Arm, Patient Position: Sitting, Cuff Size: Large)   Pulse 93   Ht 6' 2 (1.88 m)   Wt 267 lb (121.1 kg)   SpO2 97%   BMI 34.28 kg/m    Physical Exam Vitals and nursing note reviewed.  Constitutional:      General: He is not in acute distress.    Appearance: Normal appearance.  HENT:     Head: Normocephalic and atraumatic.     Right Ear: External ear normal.     Left Ear: External ear normal.     Nose: Nose normal.  Eyes:     Conjunctiva/sclera: Conjunctivae normal.  Cardiovascular:     Rate and Rhythm: Normal rate.  Pulmonary:     Effort: Pulmonary effort is normal.  Musculoskeletal:     Comments: Left pointer finger swelling at DIP with tenderness to palpation of DIP joint. DIP joint held in flexion. Able to move into extension on passive ROM but not AROM  Neurological:     General: No focal deficit present.     Mental Status: He is alert and oriented to person, place, and time.  Psychiatric:        Mood and Affect: Mood normal.        Behavior: Behavior normal.        Thought Content: Thought content normal.        Judgment: Judgment normal.     No results found for any visits on 12/09/23.      Assessment & Plan:   Problem List Items Addressed This Visit       Other   Finger  injury, left, initial encounter - Primary   [name] presents with left pointer finger injury sustained after being hit with a basketball while photographing the game. Based on exam and history this sounds like mallet finger of left pointer finger. Pt says he bought a splint and I provided him with information and education for using the splint. Recommended ice and anti-inflammatories for pain.  - ordered xray to rule out any fracture - will follow up with pcp in 3 weeks. Did make pt aware that likely this condition takes 6-8 weeks to heal and he is aware.      Relevant Orders   DG Hand Complete Left    No orders of the defined types were placed in this encounter.   Return in about 3 weeks (around 12/30/2023) for follow up with PCP.  Bernice GORMAN [place], DO

## 2023-12-09 NOTE — Assessment & Plan Note (Signed)
 [name] presents with left pointer finger injury sustained after being hit with a basketball while photographing the game. Based on exam and history this sounds like mallet finger of left pointer finger. Pt says he bought a splint and I provided him with information and education for using the splint. Recommended ice and anti-inflammatories for pain.  - ordered xray to rule out any fracture - will follow up with pcp in 3 weeks. Did make pt aware that likely this condition takes 6-8 weeks to heal and he is aware.

## 2023-12-10 ENCOUNTER — Ambulatory Visit (INDEPENDENT_AMBULATORY_CARE_PROVIDER_SITE_OTHER): Payer: PPO

## 2023-12-10 DIAGNOSIS — M1812 Unilateral primary osteoarthritis of first carpometacarpal joint, left hand: Secondary | ICD-10-CM | POA: Diagnosis not present

## 2023-12-10 DIAGNOSIS — S6992XA Unspecified injury of left wrist, hand and finger(s), initial encounter: Secondary | ICD-10-CM

## 2023-12-10 DIAGNOSIS — S62635A Displaced fracture of distal phalanx of left ring finger, initial encounter for closed fracture: Secondary | ICD-10-CM | POA: Diagnosis not present

## 2023-12-31 ENCOUNTER — Ambulatory Visit (INDEPENDENT_AMBULATORY_CARE_PROVIDER_SITE_OTHER): Payer: PPO | Admitting: Family Medicine

## 2023-12-31 ENCOUNTER — Encounter: Payer: Self-pay | Admitting: Family Medicine

## 2023-12-31 VITALS — BP 122/79 | HR 89 | Ht 74.0 in | Wt 272.0 lb

## 2023-12-31 DIAGNOSIS — M20012 Mallet finger of left finger(s): Secondary | ICD-10-CM | POA: Diagnosis not present

## 2023-12-31 NOTE — Progress Notes (Signed)
Terry Acosta - 73 y.o. male MRN [id number]  Date of birth: October 12, 1951  Subjective Chief Complaint  Patient presents with   Medical Management of Chronic Issues    HPI Terry Acosta is a 73 y.o. male here today for follow up of finger injury.  Injured ring finger of L hand.  His injury occurred about 7 weeks ago.  He has pretty good range of motion but is still not able to fully extend the finger.  This causes trouble with him typing especially when trying to type "W" on the keyboard.  Pain is fairly minimal.  He has been using splint consistently.   ROS:  A comprehensive ROS was completed and negative except as noted per HPI  Allergies  Allergen Reactions   Penicillins Other (See Comments)    Syncope   Shellfish Allergy     Past Medical History:  Diagnosis Date   CAD (coronary artery disease)    Colon polyps    Diverticulitis    GERD (gastroesophageal reflux disease)    High cholesterol    History of kidney stones 07/01/2022   3 mm   Hypertension     Past Surgical History:  Procedure Laterality Date   ABDOMINAL PERINEAL BOWEL RESECTION     x 2   HERNIA REPAIR     x 6   KNEE ARTHROSCOPY Bilateral    TONSILLECTOMY      Social History   Socioeconomic History   Marital status: Married    Spouse name: Viaan Knippenberg   Number of children: 2   Years of education: 16   Highest education level: Bachelor's degree (e.g., BA, AB, BS)  Occupational History   Occupation: Self Employed   Occupation: Retired  Tobacco Use   Smoking status: Never   Smokeless tobacco: Never  Vaping Use   Vaping status: Never Used  Substance and Sexual Activity   Alcohol use: Yes    Alcohol/week: 5.0 standard drinks of alcohol    Types: 5 Standard drinks or equivalent per week   Drug use: Never   Sexual activity: Yes    Partners: Female  Other Topics Concern   Not on file  Social History Narrative   Lives with his wife. He enjoys photography, music and volunteering.    Social Drivers of Corporate investment banker Strain: Low Risk  (12/28/2023)   Overall Financial Resource Strain (CARDIA)    Difficulty of Paying Living Expenses: Not very hard  Food Insecurity: No Food Insecurity (12/28/2023)   Hunger Vital Sign    Worried About Running Out of Food in the Last Year: Never true    Ran Out of Food in the Last Year: Never true  Transportation Needs: No Transportation Needs (12/28/2023)   PRAPARE - Administrator, Civil Service (Medical): No    Lack of Transportation (Non-Medical): No  Physical Activity: Sufficiently Active (12/28/2023)   Exercise Vital Sign    Days of Exercise per Week: 5 days    Minutes of Exercise per Session: 60 min  Stress: No Stress Concern Present (12/28/2023)   [employer] of Occupational Health - Occupational Stress Questionnaire    Feeling of Stress : Not at all  Social Connections: Socially Integrated (12/28/2023)   Social Connection and Isolation Panel [NHANES]    Frequency of Communication with Friends and Family: More than three times a week    Frequency of Social Gatherings with Friends and Family: Once a week    Attends Religious Services: More than  4 times per year    Active Member of Clubs or Organizations: Yes    Attends Banker Meetings: More than 4 times per year    Marital Status: Married    Family History  Problem Relation Age of Onset   Hypertension Father    Prostate cancer Father    Colon cancer Neg Hx    Stomach cancer Neg Hx    Esophageal cancer Neg Hx    Pancreatic cancer Neg Hx     Health Maintenance  Topic Date Due   Medicare Annual Wellness (AWV)  10/04/2023   DTaP/Tdap/Td (2 - Tdap) 12/16/2023   INFLUENZA VACCINE  02/29/2024 (Originally 07/02/2023)   Hepatitis C Screening  12/30/2024 (Originally 02/07/1967)   COVID-19 Vaccine (4 - 2024-25 season) 01/15/2025 (Originally 08/02/2023)   Colonoscopy  06/01/2028   Pneumonia Vaccine 54+ Years old  Completed   Zoster  Vaccines- Shingrix  Completed   HPV VACCINES  Aged Out     ----------------------------------------------------------------------------------------------------------------------------------------------------------------------------------------------------------------- Physical Exam BP 122/79 (BP Location: Left Arm, Patient Position: Sitting, Cuff Size: Large)   Pulse 89   Ht 6\' 2"  (1.88 m)   Wt 272 lb (123.4 kg)   SpO2 98%   BMI 34.92 kg/m   Physical Exam Constitutional:      Appearance: Normal appearance.  HENT:     Head: Normocephalic and atraumatic.  Musculoskeletal:     Comments: Still with mallet deformity of L ring finger.  Unable to fully extend DIP.   Neurological:     Mental Status: He is alert.     ------------------------------------------------------------------------------------------------------------------------------------------------------------------------------------------------------------------- Assessment and Plan  Mallet deformity of left ring finger Still unable to fully extend DIP.  Referral placed to hand surgeon and occupational therapy.    No orders of the defined types were placed in this encounter.   No follow-ups on file.    This visit occurred during the SARS-CoV-2 public health emergency.  Safety protocols were in place, including screening questions prior to the visit, additional usage of staff PPE, and extensive cleaning of exam room while observing appropriate contact time as indicated for disinfecting solutions.

## 2023-12-31 NOTE — Assessment & Plan Note (Signed)
Still unable to fully extend DIP.  Referral placed to hand surgeon and occupational therapy.

## 2023-12-31 NOTE — Patient Instructions (Signed)
Mallet Finger  Mallet finger is an injury that occurs when an object hits the tip of your straightened finger or thumb. It is also known as baseball finger. The blow to your fingertip causes it to bend more than normal, which tears the cord that attaches to the tip of your finger (extensor tendon).  Your extensor tendon is what straightens the end of your finger. If this tendon is damaged, you will not be able to straighten your fingertip. Sometimes, a piece of bone may be pulled away with the tendon (avulsion injury), or the tendon may tear completely. In some cases, surgery may be required to repair the damage. What are the causes? Mallet finger is caused by a hard, direct hit to the tip of your finger or thumb. This injury often happens from getting hit in the finger with a hard ball, such as a baseball. What increases the risk? This injury is more likely to happen if you play a sport that uses a hard ball. What are the signs or symptoms? The main symptom of this injury is the inability to straighten the tip of your finger. You can manually straighten your fingertip with your other hand, but the finger cannot straighten on its own. Other symptoms may include: Pain. Swelling. Bruising. Blood under the fingernail. How is this diagnosed? Your health care provider may suspect mallet finger if you are not able to extend your fingertip, especially if you recently injured your hand. Your health care provider will do a physical exam. This may include X-rays to see if a piece of bone has been pulled away or if the finger joint has separated (dislocated). How is this treated? Mallet finger may be treated with: A splint on your fingertip to keep it straight (extended) while the tendon heals. Surgery to repair the tendon. This is done in severe cases. This may involve: Using a pin or screw to keep your finger extended and your tendon attached. Using a piece of tendon from another part of your body  (graft) to replace a torn tendon. Follow these instructions at home: If you have a removable splint: Wear the splint as told by your health care provider. Remove it only as told by your health care provider. Check the skin around the splint every day. Tell your health care provider about any concerns. Loosen the splint if your fingers tingle, become numb, or turn cold and blue. Keep the splint clean. If the splint is not waterproof: Do not let it get wet. Cover it with a watertight covering when you take a bath or a shower. If you remove your splint to dry it or change it: Gently press your finger on a flat surface to keep it straight. Failing to do so may lead to a permanent injury or force you to wear the splint for a longer period of time. Check the skin under the splint. Tell your health care provider if you notice a blister or red and raw skin. Managing pain, stiffness, and swelling  If directed, put ice on the injured area. To do this: If you have a removable splint, remove it as told by your health care provider. Put ice in a plastic bag. Place a towel between your skin and the bag. Leave the ice on for 20 minutes, 2-3 times a day. Remove the ice if your skin turns bright red. This is very important. If you cannot feel pain, heat, or cold, you have a greater risk of damage to the area.  Move your fingers often to reduce stiffness and swelling. Raise (elevate)the injured area above the level of your heart while you are sitting or lying down. General instructions Take over-the-counter and prescription medicines only as told by your health care provider. Ask your health care provider if the medicine prescribed to you requires you to avoid driving or using machinery. Keep all follow-up visits. This is important. Contact a health care provider if: You have pain or swelling that is getting worse. Your finger feels cold. You cannot extend your finger after treatment. You notice that the  skin under the splint is red, raw, or has a blister. Get help right away if: Even after loosening your splint, your finger is: Very red and swollen. White or blue. Numb or tingling. Summary Mallet finger is an injury that occurs from a hard, direct hit to the tip of your finger or thumb. The blow to your fingertip causes it to bend more than normal, tearing the tendon that straightens the end of your finger. You cannot straighten your fingertip if this tendon is torn. This injury often happens from getting hit in the finger with a hard ball, such as a baseball. Treatment will depend on how severe the injury is. You may need to wear a splint to keep the finger straight while it heals. A more severe injury may require surgery to repair the tendon. This information is not intended to replace advice given to you by your health care provider. Make sure you discuss any questions you have with your health care provider. Document Revised: 10/05/2023 Document Reviewed: 10/05/2023 Elsevier Patient Education  2024 [employer].

## 2024-01-05 ENCOUNTER — Telehealth: Payer: Self-pay | Admitting: Family Medicine

## 2024-01-05 NOTE — Telephone Encounter (Signed)
 Copied from CRM [id number]. Topic: Referral - Status >> Jan 04, 2024  4:23 PM Joesph PARAS wrote: Reason for CRM: Patient is requesting that referral for finger be re-sent to a location that actually does what he needs done - treats his diagnosis - as the last location does not treat this issue. Patient requesting referral for finger be sent ASAP.

## 2024-01-05 NOTE — Telephone Encounter (Signed)
 Referral, clinical notes, demographics, imaging reports and copies of insurance cards have been faxed to [facility] [place] at [phone]. Office will contact patient to schedule referral appointment.   Referral status changed to Urgent.

## 2024-01-05 NOTE — Telephone Encounter (Signed)
Contacted [facility] of [place] ( spoke with Sue Lush) referral is currently being processed. Once the referral has been scanned the system, patient will be contacted. Explained referral urgency.

## 2024-01-06 DIAGNOSIS — M20012 Mallet finger of left finger(s): Secondary | ICD-10-CM | POA: Diagnosis not present

## 2024-01-06 DIAGNOSIS — S62635A Displaced fracture of distal phalanx of left ring finger, initial encounter for closed fracture: Secondary | ICD-10-CM | POA: Diagnosis not present

## 2024-01-07 ENCOUNTER — Other Ambulatory Visit: Payer: Self-pay | Admitting: Cardiology

## 2024-01-07 DIAGNOSIS — M25642 Stiffness of left hand, not elsewhere classified: Secondary | ICD-10-CM | POA: Insufficient documentation

## 2024-01-07 DIAGNOSIS — M20012 Mallet finger of left finger(s): Secondary | ICD-10-CM | POA: Diagnosis not present

## 2024-01-07 NOTE — Telephone Encounter (Signed)
 Prescription refill request for Eliquis  received.   Indication: afib  Last office visit: [place], 07/13/2023 Scr: 0.81, 07/23/2023 Age: 73 yo  Weight: 123.4 kg

## 2024-01-13 DIAGNOSIS — S62635A Displaced fracture of distal phalanx of left ring finger, initial encounter for closed fracture: Secondary | ICD-10-CM | POA: Diagnosis not present

## 2024-01-13 DIAGNOSIS — M20012 Mallet finger of left finger(s): Secondary | ICD-10-CM | POA: Diagnosis not present

## 2024-01-21 DIAGNOSIS — M20012 Mallet finger of left finger(s): Secondary | ICD-10-CM | POA: Diagnosis not present

## 2024-01-21 DIAGNOSIS — M25642 Stiffness of left hand, not elsewhere classified: Secondary | ICD-10-CM | POA: Diagnosis not present

## 2024-01-25 ENCOUNTER — Ambulatory Visit (INDEPENDENT_AMBULATORY_CARE_PROVIDER_SITE_OTHER): Payer: PPO | Admitting: Family Medicine

## 2024-01-25 ENCOUNTER — Other Ambulatory Visit: Payer: Self-pay | Admitting: Family Medicine

## 2024-01-25 ENCOUNTER — Encounter: Payer: Self-pay | Admitting: Family Medicine

## 2024-01-25 VITALS — BP 118/73 | HR 75 | Ht 74.0 in | Wt 272.0 lb

## 2024-01-25 DIAGNOSIS — M79672 Pain in left foot: Secondary | ICD-10-CM

## 2024-01-25 DIAGNOSIS — M79671 Pain in right foot: Secondary | ICD-10-CM | POA: Diagnosis not present

## 2024-01-25 DIAGNOSIS — M20012 Mallet finger of left finger(s): Secondary | ICD-10-CM | POA: Diagnosis not present

## 2024-01-25 DIAGNOSIS — F5101 Primary insomnia: Secondary | ICD-10-CM

## 2024-01-25 DIAGNOSIS — I480 Paroxysmal atrial fibrillation: Secondary | ICD-10-CM | POA: Diagnosis not present

## 2024-01-25 DIAGNOSIS — I10 Essential (primary) hypertension: Secondary | ICD-10-CM | POA: Diagnosis not present

## 2024-01-25 NOTE — Progress Notes (Signed)
 Terry Acosta - 73 y.o. male MRN [id number]  Date of birth: 08-May-1951  Subjective Chief Complaint  Patient presents with   Hypertension   Edema    HPI Terry Acosta is a 73 y.o. male here today for follow up visit.   Last seen for mallet deformity of finger.  He is using splint and seeing OT.   He has history of HTN and PAF.  Remains on diltiazem, lisinopril and furosemide.  His BP is well controlled.  He denies new symptoms including chest pain, shortness of breath, palpitations, headache or vision changes.  He remains anticoagulated with eliquis.   Tolerating crestor well for management of HLD.   Sleeping pretty well with ambien as needed.   ROS:  A comprehensive ROS was completed and negative except as noted per HPI   Allergies  Allergen Reactions   Penicillins Other (See Comments)    Syncope   Shellfish Allergy     Past Medical History:  Diagnosis Date   CAD (coronary artery disease)    Colon polyps    Diverticulitis    GERD (gastroesophageal reflux disease)    High cholesterol    History of kidney stones 07/01/2022   3 mm   Hypertension     Past Surgical History:  Procedure Laterality Date   ABDOMINAL PERINEAL BOWEL RESECTION     x 2   HERNIA REPAIR     x 6   KNEE ARTHROSCOPY Bilateral    TONSILLECTOMY      Social History   Socioeconomic History   Marital status: Married    Spouse name: [place] Pettengill   Number of children: 2   Years of education: 16   Highest education level: Bachelor's degree (e.g., BA, AB, BS)  Occupational History   Occupation: Self Employed   Occupation: Retired  Tobacco Use   Smoking status: Never   Smokeless tobacco: Never  Vaping Use   Vaping status: Never Used  Substance and Sexual Activity   Alcohol use: Yes    Alcohol/week: 5.0 standard drinks of alcohol    Types: 5 Standard drinks or equivalent per week   Drug use: Never   Sexual activity: Yes    Partners: Female  Other Topics Concern   Not on file   Social History Narrative   Lives with his wife. He enjoys photography, music and volunteering.   Social Drivers of Corporate investment banker Strain: Low Risk  (12/28/2023)   Overall Financial Resource Strain (CARDIA)    Difficulty of Paying Living Expenses: Not very hard  Food Insecurity: Low Risk  (01/13/2024)   Received from [facility]   Hunger Vital Sign    Worried About Running Out of Food in the Last Year: Never true    Ran Out of Food in the Last Year: Never true  Transportation Needs: No Transportation Needs (01/13/2024)   Received from [facility]    In the past 12 months, has lack of reliable transportation kept you from medical appointments, meetings, work or from getting things needed for daily living? : No  Physical Activity: Sufficiently Active (12/28/2023)   Exercise Vital Sign    Days of Exercise per Week: 5 days    Minutes of Exercise per Session: 60 min  Stress: No Stress Concern Present (12/28/2023)   [employer] of Occupational Health - Occupational Stress Questionnaire    Feeling of Stress : Not at all  Social Connections: Socially Integrated (12/28/2023)   Social Connection and Isolation  Panel [NHANES]    Frequency of Communication with Friends and Family: More than three times a week    Frequency of Social Gatherings with Friends and Family: Once a week    Attends Religious Services: More than 4 times per year    Active Member of Clubs or Organizations: Yes    Attends Engineer, structural: More than 4 times per year    Marital Status: Married    Family History  Problem Relation Age of Onset   Hypertension Father    Prostate cancer Father    Colon cancer Neg Hx    Stomach cancer Neg Hx    Esophageal cancer Neg Hx    Pancreatic cancer Neg Hx     Health Maintenance  Topic Date Due   Medicare Annual Wellness (AWV)  10/04/2023   DTaP/Tdap/Td (2 - Tdap) 12/16/2023   INFLUENZA VACCINE  02/29/2024 (Originally 07/02/2023)    Hepatitis C Screening  12/30/2024 (Originally 02/07/1967)   COVID-19 Vaccine (4 - 2024-25 season) 01/15/2025 (Originally 08/02/2023)   Colonoscopy  06/01/2028   Pneumonia Vaccine 19+ Years old  Completed   Zoster Vaccines- Shingrix  Completed   HPV VACCINES  Aged Out     ----------------------------------------------------------------------------------------------------------------------------------------------------------------------------------------------------------------- Physical Exam BP (!) 144/74 (BP Location: Left Arm, Patient Position: Sitting, Cuff Size: Large)   Pulse 75   Ht 6\' 2"  (1.88 m)   Wt 272 lb (123.4 kg)   SpO2 99%   BMI 34.92 kg/m   Physical Exam Constitutional:      Appearance: Normal appearance.  Eyes:     General: No scleral icterus. Cardiovascular:     Rate and Rhythm: Normal rate and regular rhythm.  Pulmonary:     Effort: Pulmonary effort is normal.     Breath sounds: Normal breath sounds.  Neurological:     Mental Status: He is alert.  Psychiatric:        Mood and Affect: Mood normal.        Behavior: Behavior normal.     ------------------------------------------------------------------------------------------------------------------------------------------------------------------------------------------------------------------- Assessment and Plan  PAF (paroxysmal atrial fibrillation) (HCC) Management per cardiology.  Remains rate controlled with diltiazem.  Anticoagulated with eliquis and tolerating well.    Mallet deformity of left ring finger Seeing ortho and PT.  Insomnia Continue with ambien as needed.  No adverse side effects at this time.   Essential hypertension BP is well controlled at this time.  Continue current medications for management of HTN.    No orders of the defined types were placed in this encounter.   Return in about 6 months (around 07/24/2024) for Annual Exam.    This visit occurred during the SARS-CoV-2  public health emergency.  Safety protocols were in place, including screening questions prior to the visit, additional usage of staff PPE, and extensive cleaning of exam room while observing appropriate contact time as indicated for disinfecting solutions.

## 2024-01-25 NOTE — Assessment & Plan Note (Signed)
 Seeing ortho and PT.

## 2024-01-25 NOTE — Assessment & Plan Note (Signed)
Continue with ambien as needed.  No adverse side effects at this time.

## 2024-01-25 NOTE — Telephone Encounter (Signed)
 Copied from CRM [id number]. Topic: Clinical - Medication Refill >> Jan 25, 2024  9:15 AM Bo Mcclintock wrote: Most Recent Primary Care Visit:  Provider: Everrett Coombe  Department: [facility]  Visit Type: OFFICE VISIT  Date: 01/25/2024  Medication: rosuvastatin (CRESTOR) 40 MG tablet  Has the patient contacted their pharmacy? Yes (Agent: If no, request that the patient contact the pharmacy for the refill. If patient does not wish to contact the pharmacy document the reason why and proceed with request.) (Agent: If yes, when and what did the pharmacy advise?) contact PCP  Is this the correct pharmacy for this prescription? Yes If no, delete pharmacy and type the correct one.  This is the patient's preferred pharmacy:  [id number] - [place], [place] - [address] [place] [address] Phone: [phone] Fax: [phone]   Has the prescription been filled recently? No  Is the patient out of the medication? Yes  Has the patient been seen for an appointment in the last year OR does the patient have an upcoming appointment? Yes  Can we respond through MyChart? Yes  Agent: Please be advised that Rx refills may take up to 3 business days. We ask that you follow-up with your pharmacy.

## 2024-01-25 NOTE — Assessment & Plan Note (Addendum)
 Management per cardiology.  Remains rate controlled with diltiazem.  Anticoagulated with eliquis and tolerating well.

## 2024-01-25 NOTE — Assessment & Plan Note (Addendum)
 BP is well controlled at this time.  Continue current medications for management of HTN.

## 2024-01-27 ENCOUNTER — Other Ambulatory Visit: Payer: Self-pay | Admitting: Cardiology

## 2024-01-27 DIAGNOSIS — I2510 Atherosclerotic heart disease of native coronary artery without angina pectoris: Secondary | ICD-10-CM

## 2024-02-17 DIAGNOSIS — M25642 Stiffness of left hand, not elsewhere classified: Secondary | ICD-10-CM | POA: Diagnosis not present

## 2024-02-17 DIAGNOSIS — M20012 Mallet finger of left finger(s): Secondary | ICD-10-CM | POA: Diagnosis not present

## 2024-02-18 DIAGNOSIS — G4733 Obstructive sleep apnea (adult) (pediatric): Secondary | ICD-10-CM | POA: Diagnosis not present

## 2024-02-19 ENCOUNTER — Other Ambulatory Visit: Payer: Self-pay

## 2024-02-19 ENCOUNTER — Ambulatory Visit

## 2024-02-19 ENCOUNTER — Encounter: Payer: Self-pay | Admitting: Podiatry

## 2024-02-19 ENCOUNTER — Ambulatory Visit: Payer: PPO | Admitting: Podiatry

## 2024-02-19 DIAGNOSIS — M79675 Pain in left toe(s): Secondary | ICD-10-CM | POA: Diagnosis not present

## 2024-02-19 DIAGNOSIS — L03116 Cellulitis of left lower limb: Secondary | ICD-10-CM | POA: Diagnosis not present

## 2024-02-19 DIAGNOSIS — G629 Polyneuropathy, unspecified: Secondary | ICD-10-CM

## 2024-02-19 DIAGNOSIS — M7989 Other specified soft tissue disorders: Secondary | ICD-10-CM | POA: Diagnosis not present

## 2024-02-19 DIAGNOSIS — M7752 Other enthesopathy of left foot: Secondary | ICD-10-CM

## 2024-02-19 DIAGNOSIS — M79674 Pain in right toe(s): Secondary | ICD-10-CM | POA: Diagnosis not present

## 2024-02-19 DIAGNOSIS — B351 Tinea unguium: Secondary | ICD-10-CM | POA: Diagnosis not present

## 2024-02-19 DIAGNOSIS — M79671 Pain in right foot: Secondary | ICD-10-CM | POA: Diagnosis not present

## 2024-02-19 DIAGNOSIS — M79672 Pain in left foot: Secondary | ICD-10-CM | POA: Diagnosis not present

## 2024-02-19 DIAGNOSIS — M19071 Primary osteoarthritis, right ankle and foot: Secondary | ICD-10-CM | POA: Diagnosis not present

## 2024-02-19 DIAGNOSIS — L03114 Cellulitis of left upper limb: Secondary | ICD-10-CM

## 2024-02-19 DIAGNOSIS — M7751 Other enthesopathy of right foot: Secondary | ICD-10-CM

## 2024-02-19 DIAGNOSIS — M19072 Primary osteoarthritis, left ankle and foot: Secondary | ICD-10-CM | POA: Diagnosis not present

## 2024-02-19 DIAGNOSIS — M216X2 Other acquired deformities of left foot: Secondary | ICD-10-CM | POA: Diagnosis not present

## 2024-02-19 DIAGNOSIS — L03115 Cellulitis of right lower limb: Secondary | ICD-10-CM | POA: Diagnosis not present

## 2024-02-19 DIAGNOSIS — M722 Plantar fascial fibromatosis: Secondary | ICD-10-CM

## 2024-02-19 DIAGNOSIS — M25572 Pain in left ankle and joints of left foot: Secondary | ICD-10-CM | POA: Diagnosis not present

## 2024-02-19 DIAGNOSIS — Z7901 Long term (current) use of anticoagulants: Secondary | ICD-10-CM | POA: Diagnosis not present

## 2024-02-19 NOTE — Progress Notes (Signed)
  Subjective:  Patient ID: Terry Acosta, male    DOB: Apr 29, 1951,   MRN: [id number]  No chief complaint on file.   73 y.o. male presents for multiple concerns with his feet. Relates he gets numbness and tingling in both feet. Also relates swelling. He states on the left his has difficulty get the ankle to move up past a certain point. He relates he is on eliquis and interesting in getting his nails trimmed as this has been difficult for him. He has been on lamisil and nails have improved from prior.  He also relates some lower back pains. Denies Diabetes . Denies any other pedal complaints. Denies n/v/f/c.   Past Medical History:  Diagnosis Date   CAD (coronary artery disease)    Colon polyps    Diverticulitis    GERD (gastroesophageal reflux disease)    High cholesterol    History of kidney stones 07/01/2022   3 mm   Hypertension     Objective:  Physical Exam: Vascular: DP/PT pulses 2/4 bilateral. CFT <3 seconds. Absent hair growth on digits. Mild lower extremity bilateral edema.  Skin. No lacerations or abrasions bilateral feet. Nails 1-5 bilateral are thickened dystrophic and with subungual debris/  Musculoskeletal: MMT 5/5 bilateral lower extremities in DF, PF, Inversion and Eversion. Deceased ROM in DF of ankle joint. Unable to DF the left ankle to 90. No hard stop noted and moslty soft tissue relates. Equinus deformity noted on left. No tenderness to heel posterior or inferior. No pain to midfoot.  Neurological: Sensation intact to light touch. Protective sensation absent bilateral.   Assessment:   1. Pain due to onychomycosis of toenails of both feet   2. Peripheral polyneuropathy   3. Acquired equinus deformity of left foot   4. Chronic anticoagulation      Plan:  Patient was evaluated and treated and all questions answered. -Discussed and educated patient on foot care, especially with  regards to the vascular, neurological and musculoskeletal systems.  -Discussed  supportive shoes at all times and checking feet regularly.  -Mechanically debrided all nails 1-5 bilateral using sterile nail nipper and filed with dremel without incident  -Discussed equinus deformity and advised on stretching to see if this helps with ROM  -Did discuss possibility some neuropathy symptoms could be coming from his back and potentially a concern this could result in a drop foot. Advised if any worsening to be evaluated by back specialist.  -Answered all patient questions -Patient to return  in 3 months for at risk foot care -Patient advised to call the office if any problems or questions arise in the meantime.   Louann Sjogren, DPM

## 2024-02-19 NOTE — Patient Instructions (Signed)

## 2024-03-02 DIAGNOSIS — M25642 Stiffness of left hand, not elsewhere classified: Secondary | ICD-10-CM | POA: Diagnosis not present

## 2024-03-02 DIAGNOSIS — M20012 Mallet finger of left finger(s): Secondary | ICD-10-CM | POA: Diagnosis not present

## 2024-03-02 DIAGNOSIS — S62635A Displaced fracture of distal phalanx of left ring finger, initial encounter for closed fracture: Secondary | ICD-10-CM | POA: Diagnosis not present

## 2024-03-10 DIAGNOSIS — G4733 Obstructive sleep apnea (adult) (pediatric): Secondary | ICD-10-CM | POA: Diagnosis not present

## 2024-03-18 DIAGNOSIS — H526 Other disorders of refraction: Secondary | ICD-10-CM | POA: Diagnosis not present

## 2024-03-18 DIAGNOSIS — H02831 Dermatochalasis of right upper eyelid: Secondary | ICD-10-CM | POA: Diagnosis not present

## 2024-03-18 DIAGNOSIS — H26491 Other secondary cataract, right eye: Secondary | ICD-10-CM | POA: Diagnosis not present

## 2024-03-18 DIAGNOSIS — H02834 Dermatochalasis of left upper eyelid: Secondary | ICD-10-CM | POA: Diagnosis not present

## 2024-03-18 DIAGNOSIS — H25812 Combined forms of age-related cataract, left eye: Secondary | ICD-10-CM | POA: Diagnosis not present

## 2024-04-08 ENCOUNTER — Encounter: Payer: Self-pay | Admitting: Cardiology

## 2024-04-08 ENCOUNTER — Ambulatory Visit: Attending: Cardiology | Admitting: Cardiology

## 2024-04-08 VITALS — BP 128/76 | HR 70 | Ht 74.0 in | Wt 270.0 lb

## 2024-04-08 DIAGNOSIS — I10 Essential (primary) hypertension: Secondary | ICD-10-CM

## 2024-04-08 DIAGNOSIS — E7800 Pure hypercholesterolemia, unspecified: Secondary | ICD-10-CM

## 2024-04-08 DIAGNOSIS — I480 Paroxysmal atrial fibrillation: Secondary | ICD-10-CM

## 2024-04-08 DIAGNOSIS — R0600 Dyspnea, unspecified: Secondary | ICD-10-CM | POA: Diagnosis not present

## 2024-04-08 DIAGNOSIS — I2510 Atherosclerotic heart disease of native coronary artery without angina pectoris: Secondary | ICD-10-CM | POA: Diagnosis not present

## 2024-04-08 NOTE — Patient Instructions (Signed)
 Medication Instructions:  No Changes  Lab Work: Today: CBC, BMET, Lipid Panel, Liver Panel, TSH  Testing/Procedures: Your physician has requested that you have an echocardiogram. Echocardiography is a painless test that uses sound waves to create images of your heart. It provides your doctor with information about the size and shape of your heart and how well your heart's chambers and valves are working. This procedure takes approximately one hour. There are no restrictions for this procedure. Please do NOT wear cologne, perfume, aftershave, or lotions (deodorant is allowed). Please arrive 15 minutes prior to your appointment time.   Follow-Up: At [facility], you and your health needs are our priority.  As part of our continuing mission to provide you with exceptional heart care, our providers are all part of one team.  This team includes your primary Cardiologist (physician) and Advanced Practice Providers or APPs (Physician Assistants and Nurse Practitioners) who all work together to provide you with the care you need, when you need it.  Your next appointment:   1 year(s) Terry Acosta office)  Provider:   Alexandria Angel, MD   Other Instructions Please call us  or send a MyChart message with any Cardiology related questions/concerns.  [phone].  Thank you!

## 2024-04-08 NOTE — Progress Notes (Signed)
 HPI: FU CAD and atrial fibrillation. Echocardiogram 2017 showed normal LV function and mild left ventricular hypertrophy.  Abdominal ultrasound February 2019 showed no aneurysm.  Cardiac catheterization March 2019 showed less than 50% involving the mid right coronary artery and proximal LAD.  Monitor April 2023 showed sinus rhythm with paroxysmal atrial fibrillation, PVCs and 6 beats nonsustained ventricular tachycardia. Since last seen, patient describes some dyspnea on exertion worse compared to previous.  No orthopnea, PND or pedal edema.  No chest pain or syncope.  No bleeding.  He also complains of fatigue.  He also describes occasional bouts of atrial fibrillation.  Current Outpatient Medications  Medication Sig Dispense Refill   albuterol  (VENTOLIN  HFA) 108 (90 Base) MCG/ACT inhaler Inhale 2 puffs into the lungs every 6 (six) hours as needed for wheezing or shortness of breath. 8 g 0   AMBULATORY NON FORMULARY MEDICATION Please provide autopap machine with humidifier.  Pressure 6-16 cmH20.  Please provide supplies including mask per patient choice, filters, heated and humidified tubing.  Diagnosis: OSA G47.33 1 Device 0   benzonatate  (TESSALON ) 200 MG capsule Take 1 capsule (200 mg total) by mouth 2 (two) times daily as needed for cough. 20 capsule 0   Cyanocobalamin (B-12 PO) Take by mouth.     diltiazem  (CARDIZEM  CD) 120 MG 24 hr capsule TAKE 1 CAPSULE BY MOUTH DAILY 90 capsule 2   ELIQUIS  5 MG TABS tablet TAKE 1 TABLET BY MOUTH 2 TIMES A DAY 180 tablet 1   esomeprazole (NEXIUM) 20 MG packet      fluticasone -salmeterol (WIXELA INHUB) 250-50 MCG/ACT AEPB Inhale 1 puff into the lungs in the morning and at bedtime. 60 each 3   furosemide (LASIX) 40 MG tablet Take 40 mg by mouth. As needed.     hydrOXYzine  (VISTARIL ) 25 MG capsule Take 1 capsule (25 mg total) by mouth 2 (two) times daily. 180 capsule 3   lisinopril  (ZESTRIL ) 10 MG tablet TAKE 1 TABLET BY MOUTH DAILY 90 tablet 1   Misc  Natural Products (BEET ROOT PO) Take by mouth.     Multiple Vitamin (MULTIVITAMIN) capsule Take 1 capsule by mouth daily.     Respiratory Therapy Supplies DEVI by Does not apply route.     rosuvastatin  (CRESTOR ) 40 MG tablet TAKE 1 TABLET BY MOUTH DAILY 90 tablet 3   zolpidem  (AMBIEN ) 5 MG tablet TAKE 1 TABLET BY MOUTH AT BEDTIME 90 tablet 1   No current facility-administered medications for this visit.     Past Medical History:  Diagnosis Date   CAD (coronary artery disease)    Colon polyps    Diverticulitis    GERD (gastroesophageal reflux disease)    High cholesterol    History of kidney stones 07/01/2022   3 mm   Hypertension     Past Surgical History:  Procedure Laterality Date   ABDOMINAL PERINEAL BOWEL RESECTION     x 2   HERNIA REPAIR     x 6   KNEE ARTHROSCOPY Bilateral    TONSILLECTOMY      Social History   Socioeconomic History   Marital status: Married    Spouse name: Terry Acosta   Number of children: 2   Years of education: 16   Highest education level: Bachelor's degree (e.g., BA, AB, BS)  Occupational History   Occupation: Self Employed   Occupation: Retired  Tobacco Use   Smoking status: Never   Smokeless tobacco: Never  Building services engineer  status: Never Used  Substance and Sexual Activity   Alcohol use: Yes    Alcohol/week: 5.0 standard drinks of alcohol    Types: 5 Standard drinks or equivalent per week   Drug use: Never   Sexual activity: Yes    Partners: Female  Other Topics Concern   Not on file  Social History Narrative   Lives with his wife. He enjoys photography, music and volunteering.   Social Drivers of Corporate investment banker Strain: Low Risk  (12/28/2023)   Overall Financial Resource Strain (CARDIA)    Difficulty of Paying Living Expenses: Not very hard  Food Insecurity: Low Risk  (01/13/2024)   Received from [facility]   Hunger Vital Sign    Worried About Running Out of Food in the Last Year: Never true     Ran Out of Food in the Last Year: Never true  Transportation Needs: No Transportation Needs (01/13/2024)   Received from [facility]    In the past 12 months, has lack of reliable transportation kept you from medical appointments, meetings, work or from getting things needed for daily living? : No  Physical Activity: Sufficiently Active (12/28/2023)   Exercise Vital Sign    Days of Exercise per Week: 5 days    Minutes of Exercise per Session: 60 min  Stress: No Stress Concern Present (12/28/2023)   [employer] of Occupational Health - Occupational Stress Questionnaire    Feeling of Stress : Not at all  Social Connections: Socially Integrated (12/28/2023)   Social Connection and Isolation Panel [NHANES]    Frequency of Communication with Friends and Family: More than three times a week    Frequency of Social Gatherings with Friends and Family: Once a week    Attends Religious Services: More than 4 times per year    Active Member of [employer] or Organizations: Yes    Attends Banker Meetings: More than 4 times per year    Marital Status: Married  Catering manager Violence: Unknown (05/30/2023)   Received from [facility]   HITS    Physically Hurt: Not on file    Insult or Talk Down To: Not on file    Threaten Physical Harm: Not on file    Scream or Curse: Not on file    Family History  Problem Relation Age of Onset   Hypertension Father    Prostate cancer Father    Colon cancer Neg Hx    Stomach cancer Neg Hx    Esophageal cancer Neg Hx    Pancreatic cancer Neg Hx     ROS: no fevers or chills, productive cough, hemoptysis, dysphasia, odynophagia, melena, hematochezia, dysuria, hematuria, rash, seizure activity, orthopnea, PND, pedal edema, claudication. Remaining systems are negative.  Physical Exam: Well-developed well-nourished in no acute distress.  Skin is warm and dry.  HEENT is normal.  Neck is supple.  Chest is clear to  auscultation with normal expansion.  Cardiovascular exam is regular rate and rhythm.  Abdominal exam nontender or distended. No masses palpated. Extremities show no edema. neuro grossly intact   A/P  1 paroxysmal atrial fibrillation-patient remains in sinus rhythm on examination.  Continue Cardizem  and apixaban .  Will consider addition of antiarrhythmic versus referral for ablation with more frequent episodes in the future.  Check hemoglobin and renal function.  Will also repeat echocardiogram as he is describing some dyspnea on exertion.  2 hyperlipidemia-continue statin.  Check lipids and liver.  3  coronary artery disease-continue statin.  No aspirin  given need for apixaban .  4 hypertension-blood pressure elevated; however typically controlled.  Continue present medications and follow.  5 fatigue-check TSH and hemoglobin.  Terry Angel, MD

## 2024-04-11 ENCOUNTER — Telehealth: Payer: Self-pay | Admitting: Cardiology

## 2024-04-11 DIAGNOSIS — I480 Paroxysmal atrial fibrillation: Secondary | ICD-10-CM | POA: Diagnosis not present

## 2024-04-11 DIAGNOSIS — R0600 Dyspnea, unspecified: Secondary | ICD-10-CM | POA: Diagnosis not present

## 2024-04-11 DIAGNOSIS — E7800 Pure hypercholesterolemia, unspecified: Secondary | ICD-10-CM | POA: Diagnosis not present

## 2024-04-11 DIAGNOSIS — I10 Essential (primary) hypertension: Secondary | ICD-10-CM | POA: Diagnosis not present

## 2024-04-11 DIAGNOSIS — I2510 Atherosclerotic heart disease of native coronary artery without angina pectoris: Secondary | ICD-10-CM | POA: Diagnosis not present

## 2024-04-11 LAB — LIPID PANEL

## 2024-04-11 NOTE — Telephone Encounter (Signed)
   Pre-operative Risk Assessment    Patient Name: Josedavid Ramsburg  DOB: 06-04-1951 MRN: [id number]   Date of last office visit: 04/08/2024 Date of next office visit: unknown  Request for Surgical Clearance    Procedure:  Dental Extraction - Amount of Teeth to be Pulled:  1  Date of Surgery:  Clearance TBD                                Surgeon:  Dr. Henrene Locust Group or Practice Name:  [facility] Phone number:  [phone] Fax number:  [phone]   Type of Clearance Requested:   - Medical  - Pharmacy:  Hold Apixaban  (Eliquis ) Needs to know how long to hold for an extraction.   Type of Anesthesia:  Local    Additional requests/questions:     Ulysses Ganja   04/11/2024, 10:53 AM

## 2024-04-12 ENCOUNTER — Ambulatory Visit: Payer: Self-pay | Admitting: Cardiology

## 2024-04-12 DIAGNOSIS — M20012 Mallet finger of left finger(s): Secondary | ICD-10-CM | POA: Diagnosis not present

## 2024-04-12 DIAGNOSIS — S62635A Displaced fracture of distal phalanx of left ring finger, initial encounter for closed fracture: Secondary | ICD-10-CM | POA: Diagnosis not present

## 2024-04-12 LAB — HEPATIC FUNCTION PANEL
ALT: 27 IU/L (ref 0–44)
AST: 23 IU/L (ref 0–40)
Albumin: 4.3 g/dL (ref 3.8–4.8)
Alkaline Phosphatase: 68 IU/L (ref 44–121)
Bilirubin Total: 0.5 mg/dL (ref 0.0–1.2)
Bilirubin, Direct: 0.24 mg/dL (ref 0.00–0.40)
Total Protein: 6.6 g/dL (ref 6.0–8.5)

## 2024-04-12 LAB — BASIC METABOLIC PANEL WITH GFR
BUN/Creatinine Ratio: 17 (ref 10–24)
BUN: 13 mg/dL (ref 8–27)
CO2: 22 mmol/L (ref 20–29)
Calcium: 9.4 mg/dL (ref 8.6–10.2)
Chloride: 101 mmol/L (ref 96–106)
Creatinine, Ser: 0.76 mg/dL (ref 0.76–1.27)
Glucose: 115 mg/dL — ABNORMAL HIGH (ref 70–99)
Potassium: 4.6 mmol/L (ref 3.5–5.2)
Sodium: 142 mmol/L (ref 134–144)
eGFR: 94 mL/min/{1.73_m2} (ref >59)

## 2024-04-12 LAB — CBC
Hematocrit: 48.1 % (ref 37.5–51.0)
Hemoglobin: 15.6 g/dL (ref 13.0–17.7)
MCH: 30.1 pg (ref 26.6–33.0)
MCHC: 32.4 g/dL (ref 31.5–35.7)
MCV: 93 fL (ref 79–97)
Platelets: 239 10*3/uL (ref 150–450)
RBC: 5.19 x10E6/uL (ref 4.14–5.80)
RDW: 13.2 % (ref 11.6–15.4)
WBC: 7.2 10*3/uL (ref 3.4–10.8)

## 2024-04-12 LAB — LIPID PANEL
Cholesterol, Total: 120 mg/dL (ref 100–199)
HDL: 47 mg/dL (ref >39)
LDL CALC COMMENT:: 2.6 ratio (ref 0.0–5.0)
LDL Chol Calc (NIH): 56 mg/dL (ref 0–99)
Triglycerides: 86 mg/dL (ref 0–149)
VLDL Cholesterol Cal: 17 mg/dL (ref 5–40)

## 2024-04-12 LAB — TSH: TSH: 1.39 u[IU]/mL (ref 0.450–4.500)

## 2024-04-12 NOTE — Telephone Encounter (Signed)
   Patient Name: Terry Acosta  DOB: 1951/05/06 MRN: [id number]  Primary Cardiologist: Alexandria Angel, MD  Chart reviewed as part of pre-operative protocol coverage.   Dental extractions of 1-2 teeth are considered low risk procedures per guidelines and generally do not require any specific cardiac clearance. It is also generally accepted that for extractions of 1-2 teeth and dental cleanings, there is no need to interrupt blood thinner therapy.  SBE prophylaxis is not required for the patient from a cardiac standpoint.  I will route this recommendation to the requesting party via Epic fax function and remove from pre-op pool.  Please call with questions.  Morey Ar, NP 04/12/2024, 11:17 AM

## 2024-05-02 ENCOUNTER — Ambulatory Visit: Payer: PPO | Admitting: Cardiology

## 2024-05-16 DIAGNOSIS — H25812 Combined forms of age-related cataract, left eye: Secondary | ICD-10-CM | POA: Diagnosis not present

## 2024-05-17 ENCOUNTER — Ambulatory Visit (HOSPITAL_COMMUNITY)
Admission: RE | Admit: 2024-05-17 | Discharge: 2024-05-17 | Disposition: A | Discharge status: Home / Self Care | Source: Ambulatory Visit | Attending: Cardiovascular Disease | Admitting: Cardiovascular Disease

## 2024-05-17 DIAGNOSIS — I480 Paroxysmal atrial fibrillation: Secondary | ICD-10-CM

## 2024-05-17 DIAGNOSIS — R0600 Dyspnea, unspecified: Secondary | ICD-10-CM | POA: Diagnosis not present

## 2024-05-17 LAB — ECHOCARDIOGRAM COMPLETE
Area-P 1/2: 3.16 cm2
S' Lateral: 4.2 cm

## 2024-05-18 MED ORDER — METOPROLOL SUCCINATE ER 25 MG PO TB24: 25.0000 mg | ORAL_TABLET | Freq: Every day | ORAL | 3 refills | Status: AC

## 2024-05-20 ENCOUNTER — Ambulatory Visit: Admitting: Podiatry

## 2024-05-24 DIAGNOSIS — Z880 Allergy status to penicillin: Secondary | ICD-10-CM | POA: Diagnosis not present

## 2024-05-24 DIAGNOSIS — K219 Gastro-esophageal reflux disease without esophagitis: Secondary | ICD-10-CM | POA: Diagnosis not present

## 2024-05-24 DIAGNOSIS — H25812 Combined forms of age-related cataract, left eye: Secondary | ICD-10-CM | POA: Diagnosis not present

## 2024-05-24 DIAGNOSIS — H52222 Regular astigmatism, left eye: Secondary | ICD-10-CM | POA: Diagnosis not present

## 2024-05-24 DIAGNOSIS — I110 Hypertensive heart disease with heart failure: Secondary | ICD-10-CM | POA: Diagnosis not present

## 2024-05-24 DIAGNOSIS — H26491 Other secondary cataract, right eye: Secondary | ICD-10-CM | POA: Diagnosis not present

## 2024-05-24 DIAGNOSIS — Z79899 Other long term (current) drug therapy: Secondary | ICD-10-CM | POA: Diagnosis not present

## 2024-05-24 DIAGNOSIS — I509 Heart failure, unspecified: Secondary | ICD-10-CM | POA: Diagnosis not present

## 2024-05-24 DIAGNOSIS — E669 Obesity, unspecified: Secondary | ICD-10-CM | POA: Diagnosis not present

## 2024-05-25 ENCOUNTER — Encounter: Payer: Self-pay | Admitting: Family Medicine

## 2024-05-25 MED ORDER — ZOLPIDEM TARTRATE 5 MG PO TABS: 5.0000 mg | ORAL_TABLET | Freq: Every day | ORAL | 1 refills | Status: DC

## 2024-05-25 NOTE — Telephone Encounter (Signed)
 Patient requesting 90 day refill of  Zolpidem  5mg   Last written 01/092025 Last OV 01/25/2024 Upcoming appt 07/25/2024

## 2024-06-16 ENCOUNTER — Other Ambulatory Visit: Payer: Self-pay | Admitting: Cardiology

## 2024-06-16 DIAGNOSIS — I480 Paroxysmal atrial fibrillation: Secondary | ICD-10-CM

## 2024-06-22 ENCOUNTER — Encounter: Payer: Self-pay | Admitting: Family Medicine

## 2024-06-22 MED ORDER — HYDROXYZINE PAMOATE 25 MG PO CAPS: 25.0000 mg | ORAL_CAPSULE | Freq: Two times a day (BID) | ORAL | 0 refills | Status: DC

## 2024-07-04 ENCOUNTER — Other Ambulatory Visit: Payer: Self-pay | Admitting: Cardiology

## 2024-07-04 NOTE — Telephone Encounter (Signed)
 Prescription refill request for Eliquis  received. Indication:afib Last office visit:5/25 Scr:0.76  5/25 Age: 73 Weight:122.5  kg  Prescription refilled

## 2024-07-21 ENCOUNTER — Other Ambulatory Visit: Payer: Self-pay | Admitting: Cardiology

## 2024-07-21 DIAGNOSIS — I2510 Atherosclerotic heart disease of native coronary artery without angina pectoris: Secondary | ICD-10-CM

## 2024-07-25 ENCOUNTER — Ambulatory Visit (INDEPENDENT_AMBULATORY_CARE_PROVIDER_SITE_OTHER): Payer: PPO | Admitting: Family Medicine

## 2024-07-25 ENCOUNTER — Encounter: Payer: Self-pay | Admitting: Family Medicine

## 2024-07-25 VITALS — BP 144/75 | HR 63 | Ht 74.0 in | Wt 272.0 lb

## 2024-07-25 DIAGNOSIS — R5383 Other fatigue: Secondary | ICD-10-CM | POA: Diagnosis not present

## 2024-07-25 DIAGNOSIS — I2510 Atherosclerotic heart disease of native coronary artery without angina pectoris: Secondary | ICD-10-CM | POA: Diagnosis not present

## 2024-07-25 DIAGNOSIS — Z0000 Encounter for general adult medical examination without abnormal findings: Secondary | ICD-10-CM | POA: Diagnosis not present

## 2024-07-25 DIAGNOSIS — R7309 Other abnormal glucose: Secondary | ICD-10-CM

## 2024-07-25 DIAGNOSIS — E7800 Pure hypercholesterolemia, unspecified: Secondary | ICD-10-CM | POA: Diagnosis not present

## 2024-07-25 DIAGNOSIS — Z125 Encounter for screening for malignant neoplasm of prostate: Secondary | ICD-10-CM

## 2024-07-25 NOTE — Patient Instructions (Signed)
 Preventive Care 73 Years and Older, Male Preventive care refers to lifestyle choices and visits with your health care provider that can promote health and wellness. Preventive care visits are also called wellness exams. What can I expect for my preventive care visit? Counseling During your preventive care visit, your health care provider may ask about your: Medical history, including: Past medical problems. Family medical history. History of falls. Current health, including: Emotional well-being. Home life and relationship well-being. Sexual activity. Memory and ability to understand (cognition). Lifestyle, including: Alcohol, nicotine or tobacco, and drug use. Access to firearms. Diet, exercise, and sleep habits. Work and work Astronomer. Sunscreen use. Safety issues such as seatbelt and bike helmet use. Physical exam Your health care provider will check your: Height and weight. These may be used to calculate your BMI (body mass index). BMI is a measurement that tells if you are at a healthy weight. Waist circumference. This measures the distance around your waistline. This measurement also tells if you are at a healthy weight and may help predict your risk of certain diseases, such as type 2 diabetes and high blood pressure. Heart rate and blood pressure. Body temperature. Skin for abnormal spots. What immunizations do I need?  Vaccines are usually given at various ages, according to a schedule. Your health care provider will recommend vaccines for you based on your age, medical history, and lifestyle or other factors, such as travel or where you work. What tests do I need? Screening Your health care provider may recommend screening tests for certain conditions. This may include: Lipid and cholesterol levels. Diabetes screening. This is done by checking your blood sugar (glucose) after you have not eaten for a while (fasting). Hepatitis C test. Hepatitis B test. HIV (human  immunodeficiency virus) test. STI (sexually transmitted infection) testing, if you are at risk. Lung cancer screening. Colorectal cancer screening. Prostate cancer screening. Abdominal aortic aneurysm (AAA) screening. You may need this if you are a current or former smoker. Talk with your health care provider about your test results, treatment options, and if necessary, the need for more tests. Follow these instructions at home: Eating and drinking  Eat a diet that includes fresh fruits and vegetables, whole grains, lean protein, and low-fat dairy products. Limit your intake of foods with high amounts of sugar, saturated fats, and salt. Take vitamin and mineral supplements as recommended by your health care provider. Do not drink alcohol if your health care provider tells you not to drink. If you drink alcohol: Limit how much you have to 0-2 drinks a day. Know how much alcohol is in your drink. In the [place] one drink equals one 12 oz bottle of beer (355 mL), one 5 oz glass of wine (148 mL), or one 1 oz glass of hard liquor (44 mL). Lifestyle Brush your teeth every morning and night with fluoride toothpaste. Floss one time each day. Exercise for at least 30 minutes 5 or more days each week. Do not use any products that contain nicotine or tobacco. These products include cigarettes, chewing tobacco, and vaping devices, such as e-cigarettes. If you need help quitting, ask your health care provider. Do not use drugs. If you are sexually active, practice safe sex. Use a condom or other form of protection to prevent STIs. Take aspirin only as told by your health care provider. Make sure that you understand how much to take and what form to take. Work with your health care provider to find out whether it is safe  and beneficial for you to take aspirin daily. Ask your health care provider if you need to take a cholesterol-lowering medicine (statin). Find healthy ways to manage stress, such  as: Meditation, yoga, or listening to music. Journaling. Talking to a trusted person. Spending time with friends and family. Safety Always wear your seat belt while driving or riding in a vehicle. Do not drive: If you have been drinking alcohol. Do not ride with someone who has been drinking. When you are tired or distracted. While texting. If you have been using any mind-altering substances or drugs. Wear a helmet and other protective equipment during sports activities. If you have firearms in your house, make sure you follow all gun safety procedures. Minimize exposure to UV radiation to reduce your risk of skin cancer. What's next? Visit your health care provider once a year for an annual wellness visit. Ask your health care provider how often you should have your eyes and teeth checked. Stay up to date on all vaccines. This information is not intended to replace advice given to you by your health care provider. Make sure you discuss any questions you have with your health care provider. Document Revised: 05/15/2021 Document Reviewed: 05/15/2021 Elsevier Patient Education  2024 [employer].

## 2024-07-25 NOTE — Assessment & Plan Note (Signed)
 Well adult Orders Placed This Encounter  Procedures   CMP14+EGFR   CBC with Differential/Platelet   Lipid Panel With LDL/HDL Ratio   PSA   HgB J8r   TSH  Screenings: per lab orders Immunizations: Recommend Tdap at local pharmacy Anticipatory guidance/Risk factor reduction:  Recommendations per AVS.

## 2024-07-25 NOTE — Progress Notes (Signed)
 Terry Acosta - 73 y.o. male MRN [id number]  Date of birth: [birth year]  Subjective Chief Complaint  Patient presents with   Annual Exam    HPI Terry Acosta is a 73 y.o. male here today for annual exam.   He reports that he is doing ok.  Has had some intermittent balance issues but denies falls.   Chronic conditions are stabile with current medications.   He continues to stay fairly active.  He is traveling quite a bit.  He feels that his diet is well controlled.   He is a non-smoker. Occasional EtOH.   Review of Systems  Constitutional:  Negative for chills, fever, malaise/fatigue and weight loss.  HENT:  Negative for congestion, ear pain and sore throat.   Eyes:  Negative for blurred vision, double vision and pain.  Respiratory:  Negative for cough and shortness of breath.   Cardiovascular:  Negative for chest pain and palpitations.  Gastrointestinal:  Negative for abdominal pain, blood in stool, constipation, heartburn and nausea.  Genitourinary:  Negative for dysuria and urgency.  Musculoskeletal:  Negative for joint pain and myalgias.  Neurological:  Negative for dizziness and headaches.  Endo/Heme/Allergies:  Does not bruise/bleed easily.  Psychiatric/Behavioral:  Negative for depression. The patient is not nervous/anxious and does not have insomnia.       Allergies  Allergen Reactions   Penicillins Other (See Comments)    Syncope   Shellfish Allergy     Past Medical History:  Diagnosis Date   CAD (coronary artery disease)    Colon polyps    Diverticulitis    GERD (gastroesophageal reflux disease)    High cholesterol    History of kidney stones 07/01/2022   3 mm   Hypertension     Past Surgical History:  Procedure Laterality Date   ABDOMINAL PERINEAL BOWEL RESECTION     x 2   HERNIA REPAIR     x 6   KNEE ARTHROSCOPY Bilateral    TONSILLECTOMY      Social History   Socioeconomic History   Marital status: Married    Spouse name: Camden Knotek   Number of children: 2   Years of education: 16   Highest education level: Bachelor's degree (e.g., BA, AB, BS)  Occupational History   Occupation: Self Employed   Occupation: Retired  Tobacco Use   Smoking status: Never   Smokeless tobacco: Never  Vaping Use   Vaping status: Never Used  Substance and Sexual Activity   Alcohol use: Yes    Alcohol/week: 5.0 standard drinks of alcohol    Types: 5 Standard drinks or equivalent per week   Drug use: Never   Sexual activity: Yes    Partners: Female  Other Topics Concern   Not on file  Social History Narrative   Lives with his wife. He enjoys photography, music and volunteering.   Social Drivers of Corporate investment banker Strain: Low Risk  (07/21/2024)   Overall Financial Resource Strain (CARDIA)    Difficulty of Paying Living Expenses: Not hard at all  Food Insecurity: No Food Insecurity (07/21/2024)   Hunger Vital Sign    Worried About Running Out of Food in the Last Year: Never true    Ran Out of Food in the Last Year: Never true  Transportation Needs: No Transportation Needs (07/21/2024)   PRAPARE - Administrator, Civil Service (Medical): No    Lack of Transportation (Non-Medical): No  Physical Activity: Sufficiently Active (07/21/2024)  Exercise Vital Sign    Days of Exercise per Week: 4 days    Minutes of Exercise per Session: 40 min  Stress: No Stress Concern Present (07/21/2024)   [employer] of Occupational Health - Occupational Stress Questionnaire    Feeling of Stress: Not at all  Social Connections: Socially Integrated (07/21/2024)   Social Connection and Isolation Panel    Frequency of Communication with Friends and Family: More than three times a week    Frequency of Social Gatherings with Friends and Family: Once a week    Attends Religious Services: More than 4 times per year    Active Member of [employer] or Organizations: Yes    Attends Engineer, structural: More than 4  times per year    Marital Status: Married    Family History  Problem Relation Age of Onset   Hypertension Father    Prostate cancer Father    Colon cancer Neg Hx    Stomach cancer Neg Hx    Esophageal cancer Neg Hx    Pancreatic cancer Neg Hx     Health Maintenance  Topic Date Due   Medicare Annual Wellness (AWV)  10/04/2023   DTaP/Tdap/Td (2 - Tdap) 12/16/2023   INFLUENZA VACCINE  07/01/2024   Hepatitis C Screening  12/30/2024 (Originally 02/07/1969)   COVID-19 Vaccine (4 - 2024-25 season) 01/15/2025 (Originally 08/02/2023)   Colonoscopy  06/01/2028   Pneumococcal Vaccine: 50+ Years  Completed   Zoster Vaccines- Shingrix  Completed   HPV VACCINES  Aged Out   Meningococcal B Vaccine  Aged Out     ----------------------------------------------------------------------------------------------------------------------------------------------------------------------------------------------------------------- Physical Exam BP (!) 144/75 (BP Location: Left Arm, Patient Position: Sitting, Cuff Size: Large)   Pulse 63   Ht 6' 2 (1.88 m)   Wt 272 lb (123.4 kg)   SpO2 97%   BMI 34.92 kg/m   Physical Exam Constitutional:      General: He is not in acute distress. HENT:     Head: Normocephalic and atraumatic.     Right Ear: Tympanic membrane and external ear normal.     Left Ear: Tympanic membrane and external ear normal.  Eyes:     General: No scleral icterus. Neck:     Thyroid : No thyromegaly.  Cardiovascular:     Rate and Rhythm: Normal rate and regular rhythm.     Heart sounds: Normal heart sounds.  Pulmonary:     Effort: Pulmonary effort is normal.     Breath sounds: Normal breath sounds.  Abdominal:     General: Bowel sounds are normal. There is no distension.     Palpations: Abdomen is soft.     Tenderness: There is no abdominal tenderness. There is no guarding.  Musculoskeletal:     Cervical back: Normal range of motion.  Lymphadenopathy:     Cervical: No  cervical adenopathy.  Skin:    General: Skin is warm and dry.     Findings: No rash.  Neurological:     Mental Status: He is alert and oriented to person, place, and time.     Cranial Nerves: No cranial nerve deficit.     Motor: No abnormal muscle tone.  Psychiatric:        Mood and Affect: Mood normal.        Behavior: Behavior normal.     ------------------------------------------------------------------------------------------------------------------------------------------------------------------------------------------------------------------- Assessment and Plan  Well adult exam Well adult Orders Placed This Encounter  Procedures   CMP14+EGFR   CBC with Differential/Platelet   Lipid Panel  With LDL/HDL Ratio   PSA   HgB J8r   TSH  Screenings: per lab orders Immunizations: Recommend Tdap at local pharmacy Anticipatory guidance/Risk factor reduction:  Recommendations per AVS.    No orders of the defined types were placed in this encounter.   No follow-ups on file.

## 2024-07-26 LAB — CBC WITH DIFFERENTIAL/PLATELET
Basophils Absolute: 0.1 x10E3/uL (ref 0.0–0.2)
Basos: 1 %
EOS (ABSOLUTE): 0.2 x10E3/uL (ref 0.0–0.4)
Eos: 3 %
Hematocrit: 45.9 % (ref 37.5–51.0)
Hemoglobin: 14.5 g/dL (ref 13.0–17.7)
Immature Grans (Abs): 0 x10E3/uL (ref 0.0–0.1)
Immature Granulocytes: 0 %
Lymphocytes Absolute: 1.7 x10E3/uL (ref 0.7–3.1)
Lymphs: 27 %
MCH: 30 pg (ref 26.6–33.0)
MCHC: 31.6 g/dL (ref 31.5–35.7)
MCV: 95 fL (ref 79–97)
Monocytes Absolute: 0.6 x10E3/uL (ref 0.1–0.9)
Monocytes: 10 %
Neutrophils Absolute: 3.6 x10E3/uL (ref 1.4–7.0)
Neutrophils: 59 %
Platelets: 191 x10E3/uL (ref 150–450)
RBC: 4.83 x10E6/uL (ref 4.14–5.80)
RDW: 13.2 % (ref 11.6–15.4)
WBC: 6.2 x10E3/uL (ref 3.4–10.8)

## 2024-07-26 LAB — CMP14+EGFR
ALT: 25 IU/L (ref 0–44)
AST: 26 IU/L (ref 0–40)
Albumin: 4 g/dL (ref 3.8–4.8)
Alkaline Phosphatase: 60 IU/L (ref 44–121)
BUN/Creatinine Ratio: 19 (ref 10–24)
BUN: 15 mg/dL (ref 8–27)
Bilirubin Total: 0.6 mg/dL (ref 0.0–1.2)
CO2: 24 mmol/L (ref 20–29)
Calcium: 9.1 mg/dL (ref 8.6–10.2)
Chloride: 102 mmol/L (ref 96–106)
Creatinine, Ser: 0.8 mg/dL (ref 0.76–1.27)
Globulin, Total: 2.2 g/dL (ref 1.5–4.5)
Glucose: 109 mg/dL — ABNORMAL HIGH (ref 70–99)
Potassium: 4.4 mmol/L (ref 3.5–5.2)
Sodium: 140 mmol/L (ref 134–144)
Total Protein: 6.2 g/dL (ref 6.0–8.5)
eGFR: 92 mL/min/1.73 (ref >59)

## 2024-07-26 LAB — HEMOGLOBIN A1C
Est. average glucose Bld gHb Est-mCnc: 140 mg/dL
Hgb A1c MFr Bld: 6.5 % — ABNORMAL HIGH (ref 4.8–5.6)

## 2024-07-26 LAB — TSH: TSH: 1.16 u[IU]/mL (ref 0.450–4.500)

## 2024-07-26 LAB — LIPID PANEL WITH LDL/HDL RATIO
Cholesterol, Total: 121 mg/dL (ref 100–199)
HDL: 47 mg/dL (ref >39)
LDL Chol Calc (NIH): 56 mg/dL (ref 0–99)
LDL/HDL Ratio: 1.2 ratio (ref 0.0–3.6)
Triglycerides: 96 mg/dL (ref 0–149)
VLDL Cholesterol Cal: 18 mg/dL (ref 5–40)

## 2024-07-26 LAB — PSA: Prostate Specific Ag, Serum: 2.2 ng/mL (ref 0.0–4.0)

## 2024-08-07 ENCOUNTER — Ambulatory Visit: Payer: Self-pay | Admitting: Family Medicine

## 2024-08-18 ENCOUNTER — Encounter

## 2024-09-05 ENCOUNTER — Other Ambulatory Visit: Payer: Self-pay | Admitting: Cardiology

## 2024-09-07 ENCOUNTER — Encounter: Payer: Self-pay | Admitting: Cardiology

## 2024-09-14 ENCOUNTER — Ambulatory Visit

## 2024-09-14 VITALS — Ht 74.0 in | Wt 266.0 lb

## 2024-09-14 DIAGNOSIS — Z0000 Encounter for general adult medical examination without abnormal findings: Secondary | ICD-10-CM

## 2024-09-14 NOTE — Progress Notes (Signed)
 Subjective:   Terry Acosta is a 73 y.o. male who presents for Medicare Annual/Subsequent preventive examination.  Visit Complete: Virtual I connected with  Terry Acosta on 09/14/24 by a audio enabled telemedicine application and verified that I am speaking with the correct person using two identifiers.  Patient Location: Home  Provider Location: Office/Clinic  I discussed the limitations of evaluation and management by telemedicine. The patient expressed understanding and agreed to proceed.  Vital Signs: Because this visit was a virtual/telehealth visit, some criteria may be missing or patient reported. Any vitals not documented were not able to be obtained and vitals that have been documented are patient reported.  Patient Medicare AWV questionnaire was completed by the patient on 09/13/2024; I have confirmed that all information answered by patient is correct and no changes since this date.  Cardiac Risk Factors include: advanced age (>65men, >22 women);obesity (BMI >30kg/m2);male gender;dyslipidemia;hypertension;family history of premature cardiovascular disease     Objective:    Today's Vitals   09/14/24 0900  Weight: 266 lb (120.7 kg)  Height: 6' 2 (1.88 m)  PainSc: 2    Body mass index is 34.15 kg/m.     09/14/2024    9:06 AM 10/03/2022    3:02 PM 06/05/2021   10:23 AM 05/29/2021    9:08 AM 01/03/2021   11:09 AM 07/05/2020   11:25 AM  Advanced Directives  Does Patient Have a Medical Advance Directive? Yes Yes Yes Yes Yes Yes  Type of Estate agent of [place];Living will Living will Healthcare Power of [employer] Power of [place];Living will Healthcare Power of [place];Living will Healthcare Power of [place];Living will;Out of facility DNR (pink MOST or yellow form)  Does patient want to make changes to medical advance directive? No - Patient declined No - Patient declined  No - Patient declined No - Patient declined No - Patient  declined  Copy of Healthcare Power of Attorney in Chart?    No - copy requested No - copy requested     Current Medications (verified) Outpatient Encounter Medications as of 09/14/2024  Medication Sig   albuterol  (VENTOLIN  HFA) 108 (90 Base) MCG/ACT inhaler Inhale 2 puffs into the lungs every 6 (six) hours as needed for wheezing or shortness of breath.   AMBULATORY NON FORMULARY MEDICATION Please provide autopap machine with humidifier.  Pressure 6-16 cmH20.  Please provide supplies including mask per patient choice, filters, heated and humidified tubing.  Diagnosis: OSA G47.33   Cyanocobalamin (B-12 PO) Take by mouth.   ELIQUIS  5 MG TABS tablet TAKE 1 TABLET BY MOUTH 2 TIMES A DAY   esomeprazole (NEXIUM) 20 MG packet    ezetimibe  (ZETIA ) 10 MG tablet TAKE 1 TABLET BY MOUTH DAILY   fluticasone -salmeterol (WIXELA INHUB) 250-50 MCG/ACT AEPB Inhale 1 puff into the lungs in the morning and at bedtime.   furosemide (LASIX) 40 MG tablet Take 40 mg by mouth. As needed.   hydrOXYzine  (VISTARIL ) 25 MG capsule Take 1 capsule (25 mg total) by mouth 2 (two) times daily.   lisinopril  (ZESTRIL ) 10 MG tablet TAKE 1 TABLET BY MOUTH DAILY   metoprolol  succinate (TOPROL  XL) 25 MG 24 hr tablet Take 1 tablet (25 mg total) by mouth at bedtime.   Misc Natural Products (BEET ROOT PO) Take by mouth.   Misc Natural Products (GLUCOSAMINE CHOND CMP TRIPLE PO)    Multiple Vitamin (MULTIVITAMIN) capsule Take 1 capsule by mouth daily.   Respiratory Therapy Supplies DEVI by Does not apply route.  zolpidem  (AMBIEN ) 5 MG tablet Take 1 tablet (5 mg total) by mouth at bedtime.   No facility-administered encounter medications on file as of 09/14/2024.    Allergies (verified) Penicillins and Shellfish allergy   History: Past Medical History:  Diagnosis Date   CAD (coronary artery disease)    Colon polyps    Diverticulitis    GERD (gastroesophageal reflux disease)    High cholesterol    History of kidney stones  07/01/2022   3 mm   Hypertension    Past Surgical History:  Procedure Laterality Date   ABDOMINAL PERINEAL BOWEL RESECTION     x 2   HERNIA REPAIR     x 6   KNEE ARTHROSCOPY Bilateral    TONSILLECTOMY     Family History  Problem Relation Age of Onset   Hypertension Father    Prostate cancer Father    Colon cancer Neg Hx    Stomach cancer Neg Hx    Esophageal cancer Neg Hx    Pancreatic cancer Neg Hx    Social History   Socioeconomic History   Marital status: Married    Spouse name: Terry Acosta   Number of children: 2   Years of education: 16   Highest education level: Bachelor's degree (e.g., BA, AB, BS)  Occupational History   Occupation: Self Employed   Occupation: Retired  Tobacco Use   Smoking status: Never   Smokeless tobacco: Never  Vaping Use   Vaping status: Never Used  Substance and Sexual Activity   Alcohol use: Yes    Alcohol/week: 5.0 standard drinks of alcohol    Types: 5 Standard drinks or equivalent per week   Drug use: Never   Sexual activity: Yes    Partners: Female  Other Topics Concern   Not on file  Social History Narrative   Lives with his wife. He enjoys photography, music and volunteering.   Social Drivers of Corporate investment banker Strain: Low Risk  (09/14/2024)   Overall Financial Resource Strain (CARDIA)    Difficulty of Paying Living Expenses: Not hard at all  Food Insecurity: No Food Insecurity (09/14/2024)   Hunger Vital Sign    Worried About Running Out of Food in the Last Year: Never true    Ran Out of Food in the Last Year: Never true  Transportation Needs: No Transportation Needs (09/14/2024)   PRAPARE - Administrator, Civil Service (Medical): No    Lack of Transportation (Non-Medical): No  Physical Activity: Insufficiently Active (09/14/2024)   Exercise Vital Sign    Days of Exercise per Week: 3 days    Minutes of Exercise per Session: 40 min  Stress: No Stress Concern Present (09/14/2024)    Terry Acosta - Occupational Stress Questionnaire    Feeling of Stress: Only a little  Social Connections: Socially Integrated (09/14/2024)   Social Connection and Isolation Panel    Frequency of Communication with Friends and Family: More than three times a week    Frequency of Social Gatherings with Friends and Family: Once a week    Attends Religious Services: More than 4 times per year    Active Member of [employer] or Organizations: Yes    Attends Engineer, structural: More than 4 times per year    Marital Status: Married    Tobacco Counseling Counseling given: Not Answered   Clinical Intake:  Pre-visit preparation completed: Yes  Pain : 0-10 Pain Score: 2  Pain Type:  Chronic pain Pain Location: Ankle Pain Onset: More than a month ago Pain Frequency: Intermittent     BMI - recorded: 34.15 Nutritional Status: BMI > 30  Obese Diabetes: No  How often do you need to have someone help you when you read instructions, pamphlets, or other written materials from your doctor or pharmacy?: 1 - Never What is the last grade level you completed in school?: 16  Interpreter Needed?: No      Activities of Daily Living    09/14/2024    9:01 AM 09/13/2024   10:34 AM  In your present state of health, do you have any difficulty performing the following activities:  Hearing? 1 1  Vision? 0 0  Difficulty concentrating or making decisions? 0 0  Walking or climbing stairs? 0 0  Dressing or bathing? 0 0  Doing errands, shopping? 0 0  Preparing Food and eating ? N N  Using the Toilet? N N  In the past six months, have you accidently leaked urine? N N  Do you have problems with loss of bowel control? N N  Managing your Medications? N N  Managing your Finances? N N  Housekeeping or managing your Housekeeping? N N    Patient Care Team: Terry Bring, DO as PCP - General (Family Medicine) Terry Redell RAMAN, MD as PCP - Cardiology  (Cardiology) Terry Acosta, Terry Garre, PA as Physician Assistant (Cardiology)  Indicate any recent Medical Services you may have received from other than [facility] providers in the past year (date may be approximate).     Assessment:   This is a routine wellness examination for Terry Acosta.  Hearing/Vision screen No results found.   Goals Addressed             This Visit's Progress    Patient Stated       Patient states he would like to lose weight.        Depression Screen    09/14/2024    9:05 AM 07/25/2024    8:40 AM 01/25/2024    8:14 AM 12/31/2023    8:43 AM 01/22/2023    8:12 AM 10/03/2022    3:05 PM 07/22/2022   11:54 AM  PHQ 2/9 Scores  PHQ - 2 Score 0 1 1 0 0 0 2    Fall Risk    09/14/2024    9:06 AM 09/13/2024   10:34 AM 08/14/2024   11:21 AM 07/25/2024    8:40 AM 01/25/2024    8:14 AM  Fall Risk   Falls in the past year? 0 0 0 0 0  Number falls in past yr: 0 0 0 0 0  Injury with Fall? 0 0 0 0 0  Risk for fall due to : No Fall Risks   No Fall Risks No Fall Risks  Follow up Falls evaluation completed   Falls evaluation completed Falls evaluation completed    MEDICARE RISK AT HOME: Medicare Risk at Home Any stairs in or around the home?: Yes If so, are there any without handrails?: No Home free of loose throw rugs in walkways, pet beds, electrical cords, etc?: Yes Adequate lighting in your home to reduce risk of falls?: Yes Life alert?: No Use of a cane, walker or w/c?: Yes Grab bars in the bathroom?: Yes Shower chair or bench in shower?: No Elevated toilet seat or a handicapped toilet?: No  TIMED UP AND GO:  Was the test performed?  No    Cognitive Function:  09/14/2024    9:07 AM 10/03/2022    3:07 PM 05/29/2021    9:15 AM  6CIT Screen  What Year? 0 points 0 points 0 points  What month? 0 points 0 points 0 points  What time? 0 points 0 points 0 points  Count back from 20 0 points 0 points 0 points  Months in reverse 0 points 0 points 0 points   Repeat phrase 0 points 0 points 0 points  Total Score 0 points 0 points 0 points    Immunizations Immunization History  Administered Date(s) Administered   INFLUENZA, HIGH DOSE SEASONAL PF 09/15/2014, 09/13/2015, 09/22/2017, 09/02/2018, 09/13/2021, 09/24/2022   Influenza Split 09/30/2012, 09/08/2016   Influenza Whole 08/31/2017, 09/01/2019   Influenza, Seasonal, Injecte, Preservative Fre 12/03/2013   Influenza-Unspecified 08/01/2020   Moderna Sars-Covid-2 Vaccination 02/08/2020, 03/07/2020, 10/01/2020   Pneumococcal Conjugate PCV 7 08/31/2017   Pneumococcal Conjugate-13 08/31/2017   Pneumococcal Polysaccharide-23 12/14/2014   Pneumococcal-Unspecified 08/31/2017   Td 12/15/2013   Zoster Recombinant(Shingrix) 09/02/2018, 11/01/2018, 01/26/2019   Zoster, Live 12/14/2014    TDAP status: Due, Education has been provided regarding the importance of this vaccine. Advised may receive this vaccine at local pharmacy or [facility]. Aware to provide a copy of the vaccination record if obtained from local pharmacy or [facility]. Verbalized acceptance and understanding.  Flu Vaccine status: Due, Education has been provided regarding the importance of this vaccine. Advised may receive this vaccine at local pharmacy or [facility]. Aware to provide a copy of the vaccination record if obtained from local pharmacy or [facility]. Verbalized acceptance and understanding.  Pneumococcal vaccine status: Up to date  Covid-19 vaccine status: Declined, Education has been provided regarding the importance of this vaccine but patient still declined. Advised may receive this vaccine at local pharmacy or [facility].or vaccine clinic. Aware to provide a copy of the vaccination record if obtained from local pharmacy or [facility]. Verbalized acceptance and understanding.  Qualifies for Shingles Vaccine? Yes   Zostavax completed Yes   Shingrix Completed?: Yes  Screening Tests [facility] Maintenance  Topic Date  Due   DTaP/Tdap/Td (2 - Tdap) 12/16/2023   Influenza Vaccine  07/01/2024   COVID-19 Vaccine (4 - 2025-26 season) 08/01/2024   Hepatitis C Screening  12/30/2024 (Originally 02/07/1969)   Medicare Annual Wellness (AWV)  09/14/2025   Colonoscopy  06/01/2028   Pneumococcal Vaccine: 50+ Years  Completed   Zoster Vaccines- Shingrix  Completed   Meningococcal B Vaccine  Aged Out    [facility] Maintenance  [facility] Maintenance Due  Topic Date Due   DTaP/Tdap/Td (2 - Tdap) 12/16/2023   Influenza Vaccine  07/01/2024   COVID-19 Vaccine (4 - 2025-26 season) 08/01/2024    Colorectal cancer screening: Type of screening: Colonoscopy. Completed 06/02/2023. Repeat every 5 years  Lung Cancer Screening: (Low Dose CT Chest recommended if Age 78-80 years, 20 pack-year currently smoking OR have quit w/in 15years.) does not qualify.   Lung Cancer Screening Referral: n/a  Additional Screening:  Hepatitis C Screening: does not qualify; Completed   Vision Screening: Recommended annual ophthalmology exams for early detection of glaucoma and other disorders of the eye. Is the patient up to date with their annual eye exam?  Yes  Who is the provider or what is the name of the office in which the patient attends annual eye exams? [facility].  If pt is not established with a provider, would they like to be referred to a provider to establish care? N/a.  Dental Screening: Recommended annual dental exams for proper oral hygiene   Community Resource Referral / Chronic Care Management: CRR required this visit?  No   CCM required this visit?  No     Plan:     I have personally reviewed and noted the following in the patient's chart:   Medical and social history Use of alcohol, tobacco or illicit drugs  Current medications and supplements including opioid prescriptions. Patient is not currently taking opioid prescriptions. Functional ability and status Nutritional status Physical  activity Advanced directives List of other physicians Hospitalizations, surgeries, and ER visits in previous 12 months. None Vitals Screenings to include cognitive, depression, and falls Referrals and appointments  In addition, I have reviewed and discussed with patient certain preventive protocols, quality metrics, and best practice recommendations. A written personalized care plan for preventive services as well as general preventive [facility] recommendations were provided to patient.     Terry Acosta, CMA   09/14/2024   After Visit Summary: (MyChart) Due to this being a telephonic visit, the after visit summary with patients personalized plan was offered to patient via MyChart   Nurse Notes:   Terry Acosta is a 73 y.o. male patient of Terry Bring, DO who had a Medicare Annual Wellness Visit today via telephone. Terry Acosta is Retired and lives with their spouse. He has 2 children. He reports that he is socially active and does interact with friends/family regularly. He is moderately physically active and enjoys  photography, music and volunteering.

## 2024-09-14 NOTE — Patient Instructions (Signed)
  Mr. Sherrow , Thank you for taking time to come for your Medicare Wellness Visit. I appreciate your ongoing commitment to your health goals. Please review the following plan we discussed and let me know if I can assist you in the future.   These are the goals we discussed:  Goals       Patient Stated (pt-stated)      05/29/2021 AWV Goal: Exercise for General Health  Patient will verbalize understanding of the benefits of increased physical activity: Exercising regularly is important. It will improve your overall fitness, flexibility, and endurance. Regular exercise also will improve your overall health. It can help you control your weight, reduce stress, and improve your bone density. Over the next year, patient will increase physical activity as tolerated with a goal of at least 150 minutes of moderate physical activity per week.  You can tell that you are exercising at a moderate intensity if your heart starts beating faster and you start breathing faster but can still hold a conversation. Moderate-intensity exercise ideas include: Walking 1 mile (1.6 km) in about 15 minutes Biking Hiking Golfing Dancing Water aerobics Patient will verbalize understanding of everyday activities that increase physical activity by providing examples like the following: Yard work, such as: Insurance underwriter Gardening Washing windows or floors Patient will be able to explain general safety guidelines for exercising:  Before you start a new exercise program, talk with your health care provider. Do not exercise so much that you hurt yourself, feel dizzy, or get very short of breath. Wear comfortable clothes and wear shoes with good support. Drink plenty of water while you exercise to prevent dehydration or heat stroke. Work out until your breathing and your heartbeat get faster.       Patient Stated (pt-stated)       Patient stated that he would like 10-15 lbs.      Patient Stated      Patient states he would like to lose weight.         This is a list of the screening recommended for you and due dates:  Health Maintenance  Topic Date Due   DTaP/Tdap/Td vaccine (2 - Tdap) 12/16/2023   Flu Shot  07/01/2024   COVID-19 Vaccine (4 - 2025-26 season) 08/01/2024   Hepatitis C Screening  12/30/2024*   Medicare Annual Wellness Visit  09/14/2025   Colon Cancer Screening  06/01/2028   Pneumococcal Vaccine for age over 52  Completed   Zoster (Shingles) Vaccine  Completed   Meningitis B Vaccine  Aged Out  *Topic was postponed. The date shown is not the original due date.

## 2024-09-24 ENCOUNTER — Encounter: Payer: Self-pay | Admitting: Family Medicine

## 2024-09-26 NOTE — Telephone Encounter (Signed)
 Requesting a rx rf of Furosemide 40mg   Last written by historical provider  Last OV 07/25/2024 Upcoming appt 01/25/2025

## 2024-09-27 MED ORDER — FUROSEMIDE 40 MG PO TABS: 40.0000 mg | ORAL_TABLET | Freq: Every day | ORAL | 3 refills | Status: AC

## 2024-09-28 DIAGNOSIS — G4733 Obstructive sleep apnea (adult) (pediatric): Secondary | ICD-10-CM | POA: Diagnosis not present

## 2024-09-28 DIAGNOSIS — B351 Tinea unguium: Secondary | ICD-10-CM | POA: Diagnosis not present

## 2024-09-28 DIAGNOSIS — L578 Other skin changes due to chronic exposure to nonionizing radiation: Secondary | ICD-10-CM | POA: Diagnosis not present

## 2024-09-28 DIAGNOSIS — L570 Actinic keratosis: Secondary | ICD-10-CM | POA: Diagnosis not present

## 2024-09-28 DIAGNOSIS — L821 Other seborrheic keratosis: Secondary | ICD-10-CM | POA: Diagnosis not present

## 2024-09-28 NOTE — Telephone Encounter (Signed)
 Last read by Carlin Mauricia Browning at 7:20AM on 09/28/2024.

## 2024-09-28 NOTE — Progress Notes (Signed)
 Delray Reza                                          MRN: [id number]   09/28/2024   The VBCI Quality Team Specialist reviewed this patient medical record for the purposes of chart review for care gap closure. The following were reviewed: chart review for care gap closure-controlling blood pressure.    VBCI Quality Team

## 2024-10-31 ENCOUNTER — Ambulatory Visit: Admitting: Bariatrics

## 2024-10-31 ENCOUNTER — Encounter: Payer: Self-pay | Admitting: Bariatrics

## 2024-10-31 VITALS — BP 163/86 | HR 78 | Temp 97.7°F | Ht 73.0 in | Wt 273.0 lb

## 2024-10-31 DIAGNOSIS — Z6836 Body mass index (BMI) 36.0-36.9, adult: Secondary | ICD-10-CM

## 2024-10-31 DIAGNOSIS — E7800 Pure hypercholesterolemia, unspecified: Secondary | ICD-10-CM | POA: Diagnosis not present

## 2024-10-31 DIAGNOSIS — E66812 Obesity, class 2: Secondary | ICD-10-CM

## 2024-10-31 NOTE — Progress Notes (Signed)
 Office: [phone]  /  Fax: [phone]   Initial Visit  Yama Nielson was seen in clinic today to evaluate for obesity. He is interested in losing weight to improve overall health and reduce the risk of weight related complications. He presents today to review program treatment options, initial physical assessment, and evaluation.     He was referred by: Friend or Family  When asked what else they would like to accomplish? He states: Adopt a healthier eating pattern and lifestyle, Improve energy levels and physical activity, Improve existing medical conditions, and Improve quality of life  When asked how has your weight affected you? He states: Contributed to medical problems, Having fatigue, and Having poor endurance  Some associated conditions: Arthritis:multiple joints. , Hyperlipidemia, and OSA  Contributing factors: moderate to high levels of stress and multiple weight loss attempts in the past  Weight promoting medications identified: None  Current nutrition plan: None and Portion control / smart choices  Current level of physical activity: walking and going to the gym   Current or previous pharmacotherapy: None  Response to medication: Never tried medications   Past medical history includes:   Past Medical History:  Diagnosis Date   CAD (coronary artery disease)    Colon polyps    Diverticulitis    GERD (gastroesophageal reflux disease)    High cholesterol    History of kidney stones 07/01/2022   3 mm   Hypertension      Objective:   BP (!) 163/86   Pulse 78   Temp 97.7 F (36.5 C)   Ht 6' 1 (1.854 m)   Wt 273 lb (123.8 kg)   SpO2 97%   BMI 36.02 kg/m  He was weighed on the bioimpedance scale: Body mass index is 36.02 kg/m.  Peak Weight:270 lbs , Body Fat%:40.2 %, Visceral Fat Rating:26, Weight trend over the last 12 months: Unchanged  General:  Alert, oriented and cooperative. Patient is in no acute distress.  Respiratory: Normal respiratory  effort, no problems with respiration noted  Extremities: Normal range of motion.    Mental Status: Normal mood and affect. Normal behavior. Normal judgment and thought content.   DIAGNOSTIC DATA REVIEWED:  BMET    Component Value Date/Time   NA 140 07/25/2024 0856   K 4.4 07/25/2024 0856   CL 102 07/25/2024 0856   CO2 24 07/25/2024 0856   GLUCOSE 109 (H) 07/25/2024 0856   GLUCOSE 126 (H) 01/22/2023 0834   BUN 15 07/25/2024 0856   CREATININE 0.80 07/25/2024 0856   CREATININE 0.75 01/22/2023 0834   CALCIUM  9.1 07/25/2024 0856   GFRNONAA 96 07/06/2020 0941   GFRAA 111 07/06/2020 0941   Lab Results  Component Value Date   HGBA1C 6.5 (H) 07/25/2024   HGBA1C 6.3 (H) 07/23/2023   No results found for: INSULIN CBC    Component Value Date/Time   WBC 6.2 07/25/2024 0856   WBC 5.5 01/22/2023 0834   RBC 4.83 07/25/2024 0856   RBC 4.76 01/22/2023 0834   HGB 14.5 07/25/2024 0856   HCT 45.9 07/25/2024 0856   PLT 191 07/25/2024 0856   MCV 95 07/25/2024 0856   MCH 30.0 07/25/2024 0856   MCH 29.4 01/22/2023 0834   MCHC 31.6 07/25/2024 0856   MCHC 32.7 01/22/2023 0834   RDW 13.2 07/25/2024 0856   Iron/TIBC/Ferritin/ %Sat    Component Value Date/Time   IRON 74 01/08/2021 0821   TIBC 332 01/08/2021 0821   FERRITIN 32 01/08/2021 0821   IRONPCTSAT  22 01/08/2021 0821   Lipid Panel     Component Value Date/Time   CHOL 121 07/25/2024 0856   TRIG 96 07/25/2024 0856   HDL 47 07/25/2024 0856   CHOLHDL 2.6 04/11/2024 0819   CHOLHDL 3.1 09/29/2022 0820   LDLCALC 56 07/25/2024 0856   LDLCALC 71 09/29/2022 0820   LDLDIRECT 71 07/06/2020 0941   Hepatic Function Panel     Component Value Date/Time   PROT 6.2 07/25/2024 0856   ALBUMIN 4.0 07/25/2024 0856   AST 26 07/25/2024 0856   ALT 25 07/25/2024 0856   ALKPHOS 60 07/25/2024 0856   BILITOT 0.6 07/25/2024 0856   BILIDIR 0.24 04/11/2024 0819      Component Value Date/Time   TSH 1.160 07/25/2024 0856     Assessment and  Plan:   Pure hypercholesterolemia:   He is taking Crestor .    Plan:  Will continue Crestor .  Will begin the plan and exercise.    Generalized Obesity: Current BMI 36    Obesity Treatment / Action Plan:  Patient will work on garnering support from family and friends to begin weight loss journey. Will work on eliminating or reducing the presence of highly palatable, calorie dense foods in the home. Will complete provided nutritional and psychosocial assessment questionnaire before the next appointment. Will be scheduled for indirect calorimetry to determine resting energy expenditure in a fasting state.  This will allow us  to create a reduced calorie, high-protein meal plan to promote loss of fat mass while preserving muscle mass. Counseled on the health benefits of losing 5%-15% of total body weight. Was counseled on nutritional approaches to weight loss and benefits of reducing processed foods and consuming plant-based foods and high quality protein as part of nutritional weight management. Was counseled on pharmacotherapy and role as an adjunct in weight management.   Obesity Education Performed Today:  He was weighed on the bioimpedance scale and results were discussed and documented in the synopsis.  We discussed obesity as a disease and the importance of a more detailed evaluation of all the factors contributing to the disease.  We discussed the importance of long term lifestyle changes which include nutrition, exercise and behavioral modifications as well as the importance of customizing this to his specific health and social needs.  We discussed the benefits of reaching a healthier weight to alleviate the symptoms of existing conditions and reduce the risks of the biomechanical, metabolic and psychological effects of obesity.  Discussed New Patient/Late Arrival, and Cancellation Policies. Patient voiced understanding and allowed to ask questions.   Raymone Mccreadie appears  to be in the action stage of change and states they are ready to start intensive lifestyle modifications and behavioral modifications.  30 minutes was spent today on this visit including the above counseling, pre-visit chart review, and post-visit documentation.  Reviewed by clinician on day of visit: allergies, medications, problem list, medical history, surgical history, family history, social history, and previous encounter notes.    Nishka Heide A. Delores CORDOBAO.

## 2024-11-28 ENCOUNTER — Ambulatory Visit: Admitting: Bariatrics

## 2024-11-28 ENCOUNTER — Encounter: Payer: Self-pay | Admitting: Bariatrics

## 2024-11-28 VITALS — BP 122/78 | HR 78 | Ht 73.0 in | Wt 265.0 lb

## 2024-11-28 DIAGNOSIS — R0602 Shortness of breath: Secondary | ICD-10-CM

## 2024-11-28 DIAGNOSIS — Z0000 Encounter for general adult medical examination without abnormal findings: Secondary | ICD-10-CM

## 2024-11-28 DIAGNOSIS — Z6834 Body mass index (BMI) 34.0-34.9, adult: Secondary | ICD-10-CM | POA: Diagnosis not present

## 2024-11-28 DIAGNOSIS — E669 Obesity, unspecified: Secondary | ICD-10-CM | POA: Diagnosis not present

## 2024-11-28 DIAGNOSIS — E6609 Other obesity due to excess calories: Secondary | ICD-10-CM

## 2024-11-28 DIAGNOSIS — E7800 Pure hypercholesterolemia, unspecified: Secondary | ICD-10-CM | POA: Diagnosis not present

## 2024-11-28 DIAGNOSIS — R5383 Other fatigue: Secondary | ICD-10-CM | POA: Diagnosis not present

## 2024-11-28 DIAGNOSIS — R7309 Other abnormal glucose: Secondary | ICD-10-CM

## 2024-11-28 DIAGNOSIS — Z1331 Encounter for screening for depression: Secondary | ICD-10-CM | POA: Diagnosis not present

## 2024-11-28 NOTE — Progress Notes (Signed)
 "  At a Glance:  Vitals BP: 122/78 Pulse Rate: 78 SpO2: 99 %   Anthropometric Measurements Height: 6' 1 (1.854 m) Weight: 265 lb (120.2 kg) BMI (Calculated): 34.97 Starting Weight: 265lb Peak Weight: 278lb Waist Measurement : 43 inches   Body Composition  Body Fat %: 38.8 % Fat Mass (lbs): 103 lbs Muscle Mass (lbs): 154.4 lbs Total Body Water (lbs): 127.6 lbs Visceral Fat Rating : 24   Other Clinical Data RMR: 2477 Fasting: yes Labs: yes Today's Visit #: 1 Starting Date: 11/28/24    EKG: Normal sinus rhythm, rate 72.   Indirect Calorimeter:   Resting Metabolic Rate ( RMR):  RMR (actual): 2477 kcal RMR (calculated): 2230 kcal The calculated basal metabolic rate is 7769 thus his basal metabolic rate is better than expected.  Plan:   Indirect calorimeter completed, interpreted and reviewed with patient today and allowed to ask questions.  Discussed the implications for the chosen plan and exercise based on the RMR reading.  Will consider repeating the RMR in the future based on weight loss.    Chief Complaint:  Obesity   Subjective:  Terry Acosta (MR# [id number]) is a 73 y.o. male who presents for evaluation and treatment of obesity and related comorbidities.   Dewarren is currently in the action stage of change and ready to dedicate time achieving and maintaining a healthier weight. Jermone is interested in becoming our patient and working on intensive lifestyle modifications including (but not limited to) diet and exercise for weight loss.  Onur has been struggling with his weight. He has been unsuccessful in either losing weight, maintaining weight loss, or reaching his healthy weight goal.  Jaki's habits were reviewed today and are as follows: he thinks his family will eat healthier with him, he started gaining weight in his late twenties, and he snacks frequently in the evenings.  Current or previous pharmacotherapy: None  Response to  medication: Never tried medications  Other Fatigue Shaan admits to daytime somnolence at times and admits to waking up still tired.  Cochise generally gets 8 or 9 hours of sleep per night, and states that he has generally restful sleep. Snoring is present. Apneic episodes are not present. Epworth Sleepiness Score is 1.   Shortness of Breath Peniel notes increasing shortness of breath with exercising and seems to be worsening over time with weight gain. He notes getting out of breath sooner with activity than he used to. This has gotten worse recently. Treylon denies shortness of breath at rest or orthopnea.  Depression Screen Rien's Food and Mood (modified PHQ-9) score was 5. 5-9 mild depression     09/14/2024    9:05 AM  Depression screen PHQ 2/9  Decreased Interest 0  Down, Depressed, Hopeless 0  PHQ - 2 Score 0     Assessment and Plan:   Other Fatigue Waldron does feel that his weight is causing his energy to be lower than it should be. Fatigue may be related to obesity, depression or many other causes. Labs will be ordered, and in the meanwhile, Shakim will focus on self care including making healthy food choices, increasing physical activity and focusing on stress reduction.  Shortness of Breath Subhan does not feel that he gets out of breath more easily that he used to when he exercises. Vin's shortness of breath appears to be obesity related and exercise induced. He has agreed to work on weight loss and gradually increase exercise to treat his exercise induced shortness of breath. Will  continue to monitor closely.  Health Maintenance:   Obesity   Plan: Will do EKG, indirect calorimetry, and labs.     Vitamin D Deficiency He is at risk for vitamin D deficiency due to obesity.   Plan: Will check for vitamin D deficiency.   Elevated glucose:   His glucose was elevated at one time, but nothing significant.   Plan:  Will check a Hemoglobin A1c and insulin  level.   Pure hypercholesterinemia:  LDL is at goal. Medication(s): Zetia  and Crestor  Cardiovascular risk factors: advanced age (older than 64 for men, 58 for women), dyslipidemia, hypertension, male gender, obesity (BMI >= 30 kg/m2), and sedentary lifestyle  Lab Results  Component Value Date   CHOL 121 07/25/2024   HDL 47 07/25/2024   LDLCALC 56 07/25/2024   LDLDIRECT 71 07/06/2020   TRIG 96 07/25/2024   CHOLHDL 2.6 04/11/2024   Lab Results  Component Value Date   ALT 25 07/25/2024   AST 26 07/25/2024   ALKPHOS 60 07/25/2024   BILITOT 0.6 07/25/2024   The ASCVD Risk score (Arnett DK, et al., 2019) failed to calculate for the following reasons:   The valid total cholesterol range is 130 to 320 mg/dL  Plan:  Continue statin.  Information sheet on healthy vs unhealthy fats.  Will avoid all trans fats.  Will read labels Will minimize saturated fats except the following: low fat meats in moderation, diary, and limited dark chocolate.  Increase Omega 3 in foods, and consider an Omega 3 supplement.   Previous labs reviewed today. Date: 07/25/24 CMP, Lipids, HgbA1c, and CBC, glucose, TSH  Labs done today CMP, Lipids, Insulin, HgbA1c, Vit D, Thyroid  Panel, and CBC   Generalized Obesity: BMI (Calculated): 34.97   Sriyan is currently in the action stage of change and his goal is to begin weight loss efforts. I recommend Mani begin the structured treatment plan as follows:  He has agreed to Category 4 Plan  Exercise goals: Older adults should determine their level of effort for physical activity relative to their level of fitness.  Behavioral modification strategies:increasing lean protein intake, decreasing simple carbohydrates, increasing vegetables, increase H2O intake, decrease liquid calories, decreasing sodium intake, increase high fiber foods, no skipping meals, better snacking choices, avoiding temptations, and planning for success  He was informed of the  importance of frequent follow-up visits to maximize his success with intensive lifestyle modifications for his multiple health conditions. He was informed we would discuss his lab results at his next visit unless there is a critical issue that needs to be addressed sooner. Jermell agreed to keep his next visit at the agreed upon time to discuss these results.  Objective:  General: Cooperative, alert, well developed, in no acute distress. HEENT: Conjunctivae and lids unremarkable. Cardiovascular: Regular rhythm.  Lungs: Normal work of breathing. Neurologic: No focal deficits.   Lab Results  Component Value Date   CREATININE 0.80 07/25/2024   BUN 15 07/25/2024   NA 140 07/25/2024   K 4.4 07/25/2024   CL 102 07/25/2024   CO2 24 07/25/2024   Lab Results  Component Value Date   ALT 25 07/25/2024   AST 26 07/25/2024   ALKPHOS 60 07/25/2024   BILITOT 0.6 07/25/2024   Lab Results  Component Value Date   HGBA1C 6.5 (H) 07/25/2024   HGBA1C 6.3 (H) 07/23/2023   No results found for: INSULIN Lab Results  Component Value Date   TSH 1.160 07/25/2024   Lab Results  Component Value Date  CHOL 121 07/25/2024   HDL 47 07/25/2024   LDLCALC 56 07/25/2024   LDLDIRECT 71 07/06/2020   TRIG 96 07/25/2024   CHOLHDL 2.6 04/11/2024   Lab Results  Component Value Date   WBC 6.2 07/25/2024   HGB 14.5 07/25/2024   HCT 45.9 07/25/2024   MCV 95 07/25/2024   PLT 191 07/25/2024   Lab Results  Component Value Date   IRON 74 01/08/2021   TIBC 332 01/08/2021   FERRITIN 32 01/08/2021    Attestation Statements:  Applicable history such as the following:  allergies, medications, problem list, medical history, surgical history, family history, social history, and previous encounter notes reviewed by clinician on day of visit:  Time spent on visit in care of the patient today including the items listed below was 45 minutes.    20 minutes were spent talking about the history, 25 minutes  for face to face counseling implementing the plan, discussing the specifics of how to arrange meals, meal planning, water intake.   I spent face to face time discussing his/her plan, including breakfast, additional breakfast options, lunch, and dinner options, grocery list, and snacks.  I reviewed her indirect calorimetry. I discussed the implications for the diet plan.  We discussed basic things that can either support or degrade metabolism.  I mentioned to him that we could repeat his indirect calorimetry in approximately 3 months.  Discussed the bio-impedence test (fat %, muscle mass, and water weight) and allowed the patient to ask questions.   Discussed the following information sheets: Category 4, Grocery List, 100 Calorie Snacks, 200 Calorie Snacks, Microwave Meals, and eating out sheet and protein alternatives. We discussed all of the micro nutrients such as protein, fats, and carbohydrates.  Handouts were given.  We also discussed starchy and nonstarchy carbohydrate including natural and refined.   I reviewed the labs which were ordered from his visit on 07/25/24.   I additionally spent time documenting, reviewing, and checking the codes before submitting.   This may have been prepared with the assistance of Engineer, Civil (consulting).  Occasional wrong-word or sound-a-like substitutions may have occurred due to the inherent limitations of voice recognition software.    Clayborne Daring, DO   "

## 2024-11-29 ENCOUNTER — Encounter (INDEPENDENT_AMBULATORY_CARE_PROVIDER_SITE_OTHER): Payer: Self-pay | Admitting: Bariatrics

## 2024-11-29 DIAGNOSIS — E88819 Insulin resistance, unspecified: Secondary | ICD-10-CM | POA: Insufficient documentation

## 2024-11-29 DIAGNOSIS — E559 Vitamin D deficiency, unspecified: Secondary | ICD-10-CM | POA: Insufficient documentation

## 2024-11-29 DIAGNOSIS — E119 Type 2 diabetes mellitus without complications: Secondary | ICD-10-CM | POA: Insufficient documentation

## 2024-11-29 LAB — COMPREHENSIVE METABOLIC PANEL WITH GFR
ALT: 27 IU/L (ref 0–44)
AST: 26 IU/L (ref 0–40)
Albumin: 4.3 g/dL (ref 3.8–4.8)
Alkaline Phosphatase: 69 IU/L (ref 47–123)
BUN/Creatinine Ratio: 17 (ref 10–24)
BUN: 14 mg/dL (ref 8–27)
Bilirubin Total: 0.5 mg/dL (ref 0.0–1.2)
CO2: 27 mmol/L (ref 20–29)
Calcium: 9.5 mg/dL (ref 8.6–10.2)
Chloride: 105 mmol/L (ref 96–106)
Creatinine, Ser: 0.82 mg/dL (ref 0.76–1.27)
Globulin, Total: 2.4 g/dL (ref 1.5–4.5)
Glucose: 117 mg/dL — ABNORMAL HIGH (ref 70–99)
Potassium: 4.5 mmol/L (ref 3.5–5.2)
Sodium: 145 mmol/L — ABNORMAL HIGH (ref 134–144)
Total Protein: 6.7 g/dL (ref 6.0–8.5)
eGFR: 92 mL/min/1.73

## 2024-11-29 LAB — LIPID PANEL WITH LDL/HDL RATIO
Cholesterol, Total: 129 mg/dL (ref 100–199)
HDL: 53 mg/dL
LDL Chol Calc (NIH): 60 mg/dL (ref 0–99)
LDL/HDL Ratio: 1.1 ratio (ref 0.0–3.6)
Triglycerides: 80 mg/dL (ref 0–149)
VLDL Cholesterol Cal: 16 mg/dL (ref 5–40)

## 2024-11-29 LAB — TSH+T4F+T3FREE
Free T4: 1.22 ng/dL (ref 0.82–1.77)
T3, Free: 3.5 pg/mL (ref 2.0–4.4)
TSH: 1.21 u[IU]/mL (ref 0.450–4.500)

## 2024-11-29 LAB — CBC WITH DIFFERENTIAL/PLATELET
Basophils Absolute: 0.1 x10E3/uL (ref 0.0–0.2)
Basos: 1 %
EOS (ABSOLUTE): 0.2 x10E3/uL (ref 0.0–0.4)
Eos: 3 %
Hematocrit: 47.8 % (ref 37.5–51.0)
Hemoglobin: 15.7 g/dL (ref 13.0–17.7)
Immature Grans (Abs): 0 x10E3/uL (ref 0.0–0.1)
Immature Granulocytes: 0 %
Lymphocytes Absolute: 2 x10E3/uL (ref 0.7–3.1)
Lymphs: 28 %
MCH: 30.4 pg (ref 26.6–33.0)
MCHC: 32.8 g/dL (ref 31.5–35.7)
MCV: 93 fL (ref 79–97)
Monocytes Absolute: 0.6 x10E3/uL (ref 0.1–0.9)
Monocytes: 9 %
Neutrophils Absolute: 4.2 x10E3/uL (ref 1.4–7.0)
Neutrophils: 59 %
Platelets: 216 x10E3/uL (ref 150–450)
RBC: 5.17 x10E6/uL (ref 4.14–5.80)
RDW: 13.1 % (ref 11.6–15.4)
WBC: 7.2 x10E3/uL (ref 3.4–10.8)

## 2024-11-29 LAB — HEMOGLOBIN A1C
Est. average glucose Bld gHb Est-mCnc: 134 mg/dL
Hgb A1c MFr Bld: 6.3 % — ABNORMAL HIGH (ref 4.8–5.6)

## 2024-11-29 LAB — VITAMIN D 25 HYDROXY (VIT D DEFICIENCY, FRACTURES): Vit D, 25-Hydroxy: 36.1 ng/mL (ref 30.0–100.0)

## 2024-11-29 LAB — INSULIN, RANDOM: INSULIN: 21.5 u[IU]/mL (ref 2.6–24.9)

## 2024-12-05 ENCOUNTER — Ambulatory Visit: Admitting: Bariatrics

## 2024-12-05 ENCOUNTER — Encounter: Payer: Self-pay | Admitting: Family Medicine

## 2024-12-05 ENCOUNTER — Other Ambulatory Visit: Payer: Self-pay | Admitting: Family Medicine

## 2024-12-06 MED ORDER — ZOLPIDEM TARTRATE 5 MG PO TABS: 5.0000 mg | ORAL_TABLET | Freq: Every day | ORAL | 1 refills | Status: AC

## 2024-12-07 NOTE — Progress Notes (Signed)
 Cavon Nicolls                                          MRN: [id number]   12/07/2024   The [facility] Quality Team Specialist reviewed this patient medical record for the purposes of chart review for care gap closure. The following were reviewed: abstraction for care gap closure-controlling blood pressure.    [facility] Quality Team

## 2024-12-12 ENCOUNTER — Ambulatory Visit: Admitting: Bariatrics

## 2024-12-12 VITALS — BP 115/69 | HR 79 | Temp 98.3°F | Ht 73.0 in | Wt 265.0 lb

## 2024-12-12 DIAGNOSIS — I10 Essential (primary) hypertension: Secondary | ICD-10-CM | POA: Diagnosis not present

## 2024-12-12 DIAGNOSIS — E7800 Pure hypercholesterolemia, unspecified: Secondary | ICD-10-CM | POA: Diagnosis not present

## 2024-12-12 DIAGNOSIS — E559 Vitamin D deficiency, unspecified: Secondary | ICD-10-CM | POA: Diagnosis not present

## 2024-12-12 DIAGNOSIS — Z6834 Body mass index (BMI) 34.0-34.9, adult: Secondary | ICD-10-CM

## 2024-12-12 DIAGNOSIS — E6609 Other obesity due to excess calories: Secondary | ICD-10-CM

## 2024-12-12 DIAGNOSIS — E669 Obesity, unspecified: Secondary | ICD-10-CM | POA: Diagnosis not present

## 2024-12-12 NOTE — Progress Notes (Signed)
 "              First follow-up                                                                                               WEIGHT SUMMARY AND BIOMETRICS  Weight Lost Since Last Visit: 0  Weight Gained Since Last Visit: 0   Vitals Temp: 98.3 F (36.8 C) BP: 115/69 Pulse Rate: 79 SpO2: 97 %   Anthropometric Measurements Height: 6' 1 (1.854 m) Weight: 265 lb (120.2 kg) BMI (Calculated): 34.97 Weight at Last Visit: 265 lb Weight Lost Since Last Visit: 0 Weight Gained Since Last Visit: 0 Starting Weight: 265 lb Total Weight Loss (lbs): 0 lb (0 kg) Peak Weight: 278 lb Waist Measurement : 43 inches   Body Composition  Body Fat %: 39.1 % Fat Mass (lbs): 103.8 lbs Muscle Mass (lbs): 153.6 lbs Total Body Water (lbs): 131 lbs Visceral Fat Rating : 25   Other Clinical Data Fasting: no Labs: no Today's Visit #: 2 Starting Date: 11/28/24    OBESITY Terry Acosta is here to discuss his progress with his obesity treatment plan along with follow-up of his obesity related diagnoses.    Nutrition Plan: the Category 4 plan - 75% adherence.  Current exercise:  X3 gym x2 weekly. Interim History:  His weight remains the same.  Eating all of the food on the plan., Protein intake is as prescribed, Is not skipping meals, Not journaling consistently., and Water intake is adequate.  Positives: He states that it was not that hard to follow the plan.   Negatives: He has not been monitoring his calories and would like to start.   Pharmacotherapy: Terry Acosta is not on any anti-obesity medications.  Hunger is moderately controlled, except slightly more pronounced at night after his evening meal.  Cravings are moderately controlled.  Assessment/Plan:   Pure hypercholesterolemia:  He is currently well-controlled.  He is taking a statin per his PCP.  Plan:  Discussed lab work in detail. He will continue his statin. Will continue to work on his plan and exercise.  Will start  journaling his food on a regular basis.  Journaling numbers sheet and information about using an app to track his calories and protein.  Hypertension (essential):  Hypertension well controlled.  Medication(s): Lisinopril  10 mg and metoprolol  XL 25 mg daily    BP Readings from Last 3 Encounters:  12/12/24 115/69  11/28/24 122/78  10/31/24 (!) 163/86   Lab Results  Component Value Date   CREATININE 0.82 11/28/2024   CREATININE 0.80 07/25/2024   CREATININE 0.76 04/11/2024   No results found for: GFR  Plan: Continue all antihypertensives at current dosages. No added salt. Will keep sodium content to 1,500 mg or less per day.   Will continue the plan and exercise. He will adhere to the plan approximately 85 to 95%. He will eat his dinner a little later so that he can be less hungry in the evening. Discussed how to fortune out his snacks throughout the day. Will keep his alcohol to a minimum.  Vitamin D  Insuffiency:  Vitamin D  is not at goal of 50.  Most recent vitamin D  level was 36.1. He is not on any vitamin D .  Lab Results  Component Value Date   VD25OH 36.1 11/28/2024    Plan: Begin Vitamin D  OTC at 2,000 international units daily.      Generalized Obesity: Current BMI BMI (Calculated): 34.97   Pharmacotherapy Plan No anti-obesity medications.   Lc is currently in the action stage of change. As such, his goal is to continue with weight loss efforts.  He has agreed to the Category 4 plan.  Exercise goals: Older adults should determine their level of effort for physical activity relative to their level of fitness.   Behavioral modification strategies: increasing lean protein intake, decreasing simple carbohydrates , no meal skipping, meal planning , increase water intake, planning for success, avoiding temptations, keep healthy foods in the home, increase frequency of journaling, weigh protein portions, measure portion sizes, and mindful eating.  Terry Acosta has  agreed to follow-up with our clinic in 4 weeks.   Objective:   VITALS: Per patient if applicable, see vitals. GENERAL: Alert and in no acute distress. CARDIOPULMONARY: No increased WOB. Speaking in clear sentences.  PSYCH: Pleasant and cooperative. Speech normal rate and rhythm. Affect is appropriate. Insight and judgement are appropriate. Attention is focused, linear, and appropriate.  NEURO: Oriented as arrived to appointment on time with no prompting.   Attestation Statements:   This was prepared with the assistance of Engineer, Civil (consulting).  Occasional wrong-word or sound-a-like substitutions may have occurred due to the inherent limitations of voice recognition.   Clayborne Daring, DO    "

## 2024-12-26 ENCOUNTER — Encounter: Payer: Self-pay | Admitting: Family Medicine

## 2024-12-26 ENCOUNTER — Ambulatory Visit: Admitting: Bariatrics

## 2024-12-26 NOTE — Telephone Encounter (Signed)
 Requesting rx rf of hydroxyzine  25mg  BID Last written 06/22/2024 Last OV 07/25/2024 Upcoming appt 01/25/2025

## 2024-12-27 MED ORDER — HYDROXYZINE PAMOATE 25 MG PO CAPS: 25.0000 mg | ORAL_CAPSULE | Freq: Two times a day (BID) | ORAL | 0 refills | Status: AC

## 2025-01-04 ENCOUNTER — Other Ambulatory Visit: Payer: Self-pay | Admitting: Cardiology

## 2025-01-04 DIAGNOSIS — I480 Paroxysmal atrial fibrillation: Secondary | ICD-10-CM

## 2025-01-04 NOTE — Telephone Encounter (Signed)
 Eliquis  5mg  refill request received. Patient is 74 years old, weight-120.2kg, Crea-0.82 on 11/28/24, Diagnosis-Afib, and last seen by Dr. Pietro on 04/08/24. Dose is appropriate based on dosing criteria. Will send in refill to requested pharmacy.

## 2025-01-25 ENCOUNTER — Ambulatory Visit: Admitting: Family Medicine

## 2025-01-26 ENCOUNTER — Ambulatory Visit: Admitting: Bariatrics

## 2025-09-19 ENCOUNTER — Ambulatory Visit
